# Patient Record
Sex: Female | Born: 1960 | Race: White | Hispanic: No | Marital: Married | Smoking: Never smoker
Health system: Southern US, Community
[De-identification: ages and names within clinical notes are randomized; demographics above are authoritative.]

## PROBLEM LIST (undated history)

## (undated) DIAGNOSIS — D249 Benign neoplasm of unspecified breast: Secondary | ICD-10-CM

## (undated) DIAGNOSIS — D219 Benign neoplasm of connective and other soft tissue, unspecified: Secondary | ICD-10-CM

## (undated) DIAGNOSIS — K219 Gastro-esophageal reflux disease without esophagitis: Secondary | ICD-10-CM

## (undated) DIAGNOSIS — N6009 Solitary cyst of unspecified breast: Secondary | ICD-10-CM

## (undated) DIAGNOSIS — I341 Nonrheumatic mitral (valve) prolapse: Secondary | ICD-10-CM

## (undated) HISTORY — PX: BREAST CYST ASPIRATION: SHX578

## (undated) HISTORY — DX: Solitary cyst of unspecified breast: N60.09

## (undated) HISTORY — DX: Gastro-esophageal reflux disease without esophagitis: K21.9

## (undated) HISTORY — PX: PELVIC LAPAROSCOPY: SHX162

## (undated) HISTORY — PX: TONSILLECTOMY: SUR1361

## (undated) HISTORY — DX: Nonrheumatic mitral (valve) prolapse: I34.1

## (undated) HISTORY — DX: Benign neoplasm of unspecified breast: D24.9

## (undated) HISTORY — DX: Benign neoplasm of connective and other soft tissue, unspecified: D21.9

---

## 1998-11-21 ENCOUNTER — Other Ambulatory Visit: Admission: RE | Admit: 1998-11-21 | Discharge: 1998-11-21 | Payer: Self-pay | Admitting: Obstetrics and Gynecology

## 2000-01-14 ENCOUNTER — Other Ambulatory Visit: Admission: RE | Admit: 2000-01-14 | Discharge: 2000-01-14 | Payer: Self-pay | Admitting: Obstetrics and Gynecology

## 2001-01-31 ENCOUNTER — Other Ambulatory Visit: Admission: RE | Admit: 2001-01-31 | Discharge: 2001-01-31 | Payer: Self-pay | Admitting: Obstetrics and Gynecology

## 2002-03-22 ENCOUNTER — Other Ambulatory Visit: Admission: RE | Admit: 2002-03-22 | Discharge: 2002-03-22 | Payer: Self-pay | Admitting: Obstetrics and Gynecology

## 2003-04-18 ENCOUNTER — Other Ambulatory Visit: Admission: RE | Admit: 2003-04-18 | Discharge: 2003-04-18 | Payer: Self-pay | Admitting: Obstetrics and Gynecology

## 2004-06-02 ENCOUNTER — Other Ambulatory Visit: Admission: RE | Admit: 2004-06-02 | Discharge: 2004-06-02 | Payer: Self-pay | Admitting: Obstetrics and Gynecology

## 2004-06-18 ENCOUNTER — Ambulatory Visit: Payer: Self-pay | Admitting: Internal Medicine

## 2004-07-26 ENCOUNTER — Ambulatory Visit: Payer: Self-pay | Admitting: Family Medicine

## 2004-11-25 ENCOUNTER — Ambulatory Visit: Payer: Self-pay | Admitting: Internal Medicine

## 2005-06-04 ENCOUNTER — Ambulatory Visit: Payer: Self-pay | Admitting: Internal Medicine

## 2005-06-11 ENCOUNTER — Other Ambulatory Visit: Admission: RE | Admit: 2005-06-11 | Discharge: 2005-06-11 | Payer: Self-pay | Admitting: Obstetrics and Gynecology

## 2006-06-10 ENCOUNTER — Encounter: Admission: RE | Admit: 2006-06-10 | Discharge: 2006-06-10 | Payer: Self-pay | Admitting: Internal Medicine

## 2006-06-10 ENCOUNTER — Ambulatory Visit: Payer: Self-pay | Admitting: Internal Medicine

## 2006-09-30 ENCOUNTER — Other Ambulatory Visit: Admission: RE | Admit: 2006-09-30 | Discharge: 2006-09-30 | Payer: Self-pay | Admitting: Obstetrics and Gynecology

## 2006-11-25 ENCOUNTER — Encounter: Payer: Self-pay | Admitting: Internal Medicine

## 2007-04-12 ENCOUNTER — Telehealth (INDEPENDENT_AMBULATORY_CARE_PROVIDER_SITE_OTHER): Payer: Self-pay | Admitting: *Deleted

## 2008-03-19 ENCOUNTER — Other Ambulatory Visit: Admission: RE | Admit: 2008-03-19 | Discharge: 2008-03-19 | Payer: Self-pay | Admitting: Obstetrics and Gynecology

## 2008-04-28 ENCOUNTER — Ambulatory Visit: Payer: Self-pay | Admitting: Internal Medicine

## 2008-10-10 ENCOUNTER — Ambulatory Visit: Payer: Self-pay | Admitting: Obstetrics and Gynecology

## 2008-10-23 ENCOUNTER — Ambulatory Visit: Payer: Self-pay | Admitting: Family Medicine

## 2008-11-21 ENCOUNTER — Ambulatory Visit: Payer: Self-pay | Admitting: Obstetrics and Gynecology

## 2008-12-21 ENCOUNTER — Ambulatory Visit: Payer: Self-pay | Admitting: Internal Medicine

## 2008-12-21 DIAGNOSIS — L259 Unspecified contact dermatitis, unspecified cause: Secondary | ICD-10-CM | POA: Insufficient documentation

## 2008-12-21 DIAGNOSIS — M2550 Pain in unspecified joint: Secondary | ICD-10-CM | POA: Insufficient documentation

## 2008-12-25 LAB — CONVERTED CEMR LAB
Basophils Absolute: 0 10*3/uL (ref 0.0–0.1)
Eosinophils Relative: 1.5 % (ref 0.0–5.0)
HCT: 43.9 % (ref 36.0–46.0)
Hemoglobin: 15.4 g/dL — ABNORMAL HIGH (ref 12.0–15.0)
Lymphocytes Relative: 33.3 % (ref 12.0–46.0)
MCV: 93.1 fL (ref 78.0–100.0)
Monocytes Relative: 9.6 % (ref 3.0–12.0)
Neutrophils Relative %: 55.5 % (ref 43.0–77.0)
Platelets: 151 10*3/uL (ref 150.0–400.0)
RBC: 4.71 M/uL (ref 3.87–5.11)
TSH: 1.27 microintl units/mL (ref 0.35–5.50)
WBC: 6.7 10*3/uL (ref 4.5–10.5)

## 2009-06-04 ENCOUNTER — Other Ambulatory Visit: Admission: RE | Admit: 2009-06-04 | Discharge: 2009-06-04 | Payer: Self-pay | Admitting: Radiology

## 2009-06-07 ENCOUNTER — Encounter (INDEPENDENT_AMBULATORY_CARE_PROVIDER_SITE_OTHER): Payer: Self-pay | Admitting: *Deleted

## 2010-03-25 ENCOUNTER — Ambulatory Visit: Payer: Self-pay | Admitting: Obstetrics and Gynecology

## 2010-03-25 ENCOUNTER — Other Ambulatory Visit: Admission: RE | Admit: 2010-03-25 | Discharge: 2010-03-25 | Payer: Self-pay | Admitting: Obstetrics and Gynecology

## 2010-04-22 ENCOUNTER — Ambulatory Visit: Payer: Self-pay | Admitting: Obstetrics and Gynecology

## 2010-05-08 ENCOUNTER — Ambulatory Visit: Payer: Self-pay | Admitting: Internal Medicine

## 2010-08-26 NOTE — Assessment & Plan Note (Signed)
Summary: FLU SHOT/CB  Nurse Visit   Allergies: No Known Drug Allergies  Orders Added: 1)  Admin 1st Vaccine [90471] 2)  Flu Vaccine 63yrs + [60454]  Flu Vaccine Consent Questions     Do you have a history of severe allergic reactions to this vaccine? no    Any prior history of allergic reactions to egg and/or gelatin? no    Do you have a sensitivity to the preservative Thimersol? no    Do you have a past history of Guillan-Barre Syndrome? no    Do you currently have an acute febrile illness? no    Have you ever had a severe reaction to latex? no    Vaccine information given and explained to patient? yes    Are you currently pregnant? no    Lot Number:[id number]   Exp Date:01/24/2011   Site Given  Left Deltoid IM Romualdo Bolk, CMA Duncan Dull)  May 08, 2010 3:36 PM

## 2010-08-26 NOTE — Assessment & Plan Note (Signed)
Summary: FLU SHOT // RS  Nurse Visit   Allergies: No Known Drug Allergies  Orders Added: 1)  Admin 1st Vaccine [90471] 2)  Flu Vaccine 55yrs + [81191]     Flu Vaccine Consent Questions     Do you have a history of severe allergic reactions to this vaccine? no    Any prior history of allergic reactions to egg and/or gelatin? no    Do you have a sensitivity to the preservative Thimersol? no    Do you have a past history of Guillan-Barre Syndrome? no    Do you currently have an acute febrile illness? no    Have you ever had a severe reaction to latex? no    Vaccine information given and explained to patient? yes    Are you currently pregnant? no    Lot Number:[id number]   Exp Date:01/24/2011   Site Given  Left Deltoid IM Romualdo Bolk, CMA (AAMA)  May 30, 2010 8:08 AM

## 2011-01-12 ENCOUNTER — Ambulatory Visit (INDEPENDENT_AMBULATORY_CARE_PROVIDER_SITE_OTHER): Payer: BC Managed Care – PPO | Admitting: Obstetrics and Gynecology

## 2011-01-12 DIAGNOSIS — N63 Unspecified lump in unspecified breast: Secondary | ICD-10-CM

## 2011-01-12 DIAGNOSIS — L659 Nonscarring hair loss, unspecified: Secondary | ICD-10-CM

## 2011-01-12 DIAGNOSIS — R5383 Other fatigue: Secondary | ICD-10-CM

## 2011-01-12 DIAGNOSIS — N6009 Solitary cyst of unspecified breast: Secondary | ICD-10-CM

## 2011-04-01 ENCOUNTER — Other Ambulatory Visit (HOSPITAL_COMMUNITY)
Admission: RE | Admit: 2011-04-01 | Discharge: 2011-04-01 | Disposition: A | Discharge status: Home / Self Care | Payer: BC Managed Care – PPO | Source: Ambulatory Visit | Attending: Obstetrics and Gynecology | Admitting: Obstetrics and Gynecology

## 2011-04-01 ENCOUNTER — Encounter: Payer: Self-pay | Admitting: Obstetrics and Gynecology

## 2011-04-01 ENCOUNTER — Ambulatory Visit (INDEPENDENT_AMBULATORY_CARE_PROVIDER_SITE_OTHER): Payer: BC Managed Care – PPO | Admitting: Obstetrics and Gynecology

## 2011-04-01 ENCOUNTER — Encounter: Payer: Self-pay | Admitting: *Deleted

## 2011-04-01 DIAGNOSIS — D249 Benign neoplasm of unspecified breast: Secondary | ICD-10-CM | POA: Insufficient documentation

## 2011-04-01 DIAGNOSIS — R1031 Right lower quadrant pain: Secondary | ICD-10-CM

## 2011-04-01 DIAGNOSIS — N912 Amenorrhea, unspecified: Secondary | ICD-10-CM

## 2011-04-01 DIAGNOSIS — D219 Benign neoplasm of connective and other soft tissue, unspecified: Secondary | ICD-10-CM | POA: Insufficient documentation

## 2011-04-01 DIAGNOSIS — N809 Endometriosis, unspecified: Secondary | ICD-10-CM | POA: Insufficient documentation

## 2011-04-01 DIAGNOSIS — Z01419 Encounter for gynecological examination (general) (routine) without abnormal findings: Secondary | ICD-10-CM

## 2011-04-01 DIAGNOSIS — I341 Nonrheumatic mitral (valve) prolapse: Secondary | ICD-10-CM | POA: Insufficient documentation

## 2011-04-01 DIAGNOSIS — N6009 Solitary cyst of unspecified breast: Secondary | ICD-10-CM | POA: Insufficient documentation

## 2011-04-01 DIAGNOSIS — N898 Other specified noninflammatory disorders of vagina: Secondary | ICD-10-CM

## 2011-04-01 DIAGNOSIS — R300 Dysuria: Secondary | ICD-10-CM

## 2011-04-01 DIAGNOSIS — B373 Candidiasis of vulva and vagina: Secondary | ICD-10-CM

## 2011-04-01 DIAGNOSIS — K219 Gastro-esophageal reflux disease without esophagitis: Secondary | ICD-10-CM | POA: Insufficient documentation

## 2011-04-01 MED ORDER — CIPROFLOXACIN HCL 500 MG PO TABS: 500.0000 mg | ORAL_TABLET | Freq: Two times a day (BID) | ORAL | Status: DC

## 2011-04-01 MED ORDER — TERCONAZOLE 0.8 % VA CREA: 1.0000 | TOPICAL_CREAM | Freq: Every day | VAGINAL | Status: AC

## 2011-04-01 NOTE — Patient Instructions (Signed)
Take medication for her UTI and yeast infection. Schedule pelvic ultrasound. Discuss aditional breast imaging with Dr. Jamey Ripa in the fall after your mammogram. Call Friday for urine culture results.

## 2011-04-01 NOTE — Progress Notes (Signed)
Patient came to see me today both for annual GYN exam and a problem visit. She has had some urinary symptoms that consist of back pain and lower abdominal pain with low-grade fever. She was seen at a pharmacy clinic and treated with Macrobid. She stopped yesterday after no improvement over 4-5 days. She's  Had a vaginal discharge.  Her last menstrual period was 7 weeks ago. She will miss periods. She contracepts by vasectomy. She is having hot flashes. The radiologist last year discussed with her additional breast imaging because of her family history. She's been under a lot of stress and does not remember the conversation.  Physical examination: HEENT within normal limits. Neck: Thyroid not large. No masses. Supraclavicular nodes: not enlarged. Breasts: Examined in both sitting midline position. No skin changes and no masses. Abdomen: Soft no guarding rebound or masses or hernia. Pelvic: External: Within normal limits. BUS: Within normal limits. Vaginal:within normal limits. Good estrogen effect. No evidence of cystocele rectocele or enterocele. Cervix: clean. Uterus: enlarged by fibroids to 11-12 weeks size. Adnexa:cannot be examined due to large size of fibroids. Rectovaginal exam: Confirmatory and negative. Extremities: Within normal limits. Wet prep positive for yeast. Urinalysis negative.  Assessment: 1. Amenorrhea 2. Hot flashes 3. Yeast vaginitis 4. Possible UTI 5. Strong family history of breast cancer  Plan: 1. Patient feels bit today so we started her on Cipro 500 mg twice a day for 7 days 2.She will call for her urine culture results on Friday 3. She was scheduled pelvic ultrasound 4. She will make an appointment to discuss additional breast imaging Dr. Seward Carol after her mammogram. 5. She was treated for her yeast infection today. 6. FSH checked.

## 2011-04-03 ENCOUNTER — Telehealth: Payer: Self-pay

## 2011-04-03 LAB — URINE CULTURE: Organism ID, Bacteria: NO GROWTH

## 2011-04-03 NOTE — Telephone Encounter (Signed)
Tell patient to stop Cipro. She can take Benadryl for the rash. She does not urinary tract infection. Her white blood cell count did not show infection. I would like her to keep her appointment for her ultrasound. I also think she should see Dr. Cato Mulligan.

## 2011-04-03 NOTE — Telephone Encounter (Signed)
PER PT. REQUEST. NOTIFIED PT BY HER CELL# VOICEMAIL OF DR. G'S NOTE BELOW.

## 2011-04-03 NOTE — Telephone Encounter (Signed)
FROM WHAT I CAN TELL PTS. URINE CULTURE IS NEGATIVE. ALSO SHE STATES SHE HAS TAKEN 3 CIPRO PILLS-NONE TODAY AND IS HAVING A SEVERE RASH ON HER TRUNK, ARMS & LEGS. ALSO C-O PERSISTENT FEVER. OF 100.92F THIS A.M. TOOK IBUPROFEN & ZYRTEC AND HAS NOT RECHECKED FEVER THIS P.M. SINCE SHE IS AT WORK. IF SHE DOES NOT HAVE A UTI IS WORRIED WHAT IS CAUSING FEVER. HER PCP IS DR. SWORDS.

## 2011-04-06 ENCOUNTER — Encounter: Payer: Self-pay | Admitting: Family Medicine

## 2011-04-06 ENCOUNTER — Ambulatory Visit (INDEPENDENT_AMBULATORY_CARE_PROVIDER_SITE_OTHER): Payer: BC Managed Care – PPO | Admitting: Family Medicine

## 2011-04-06 VITALS — BP 120/70 | Temp 99.5°F | Wt 126.0 lb

## 2011-04-06 DIAGNOSIS — R509 Fever, unspecified: Secondary | ICD-10-CM

## 2011-04-06 DIAGNOSIS — R1011 Right upper quadrant pain: Secondary | ICD-10-CM

## 2011-04-06 DIAGNOSIS — R6889 Other general symptoms and signs: Secondary | ICD-10-CM

## 2011-04-06 DIAGNOSIS — R634 Abnormal weight loss: Secondary | ICD-10-CM

## 2011-04-06 DIAGNOSIS — R7989 Other specified abnormal findings of blood chemistry: Secondary | ICD-10-CM

## 2011-04-06 NOTE — Progress Notes (Addendum)
Subjective:    Patient ID: Monica English, female    DOB: 1961-06-16, 50 y.o.   MRN: [id number]  HPI Seen with chief complaint of fever for 10 days. She relates temperature between 99.3 and up to 101. Fever comes and goes over 10 days. She's had some intermittent headaches which are poorly localized and intermittent chills. Occasional nausea without vomiting. She is not aware of any specific arthralgias. End of August had some abdominal pain radiating to back. Was placed at another facility on Macrobid and subsequently saw her gynecologist. Urine culture was sent which was subsequent negative and patient switched to Cipro. She developed urticaria on Cipro and has been off all antibiotics for several days now. No dysuria at this time.  Patient denies any history of tick bite. Denies nasal congestion or cough. No arthralgias. No ill exposures. No recent travels. Denies dyspnea. Mild right upper quadrant pain which does not seem to correlate with eating. Appetite comes and goes. Denies diarrhea. No sore throat.  Past Medical History  Diagnosis Date  . Endometriosis   . Fibroid   . Fibroadenoma of breast     left  breast  . Cyst of breast     left  . Acid reflux   . Mitral valve prolapse    Past Surgical History  Procedure Date  . Tonsillectomy   . Dx lap with laser of endometrosis   . Pelvic laparoscopy   . Breast cyst aspiration     reports that she has never smoked. She has never used smokeless tobacco. She reports that she does not drink alcohol or use illicit drugs. family history includes Breast cancer in her maternal grandmother, mother, and other; Colon cancer in her other; Heart disease in her paternal grandfather and paternal grandmother; and Hypertension in her mother. Allergies  Allergen Reactions  . Ciprofloxacin     hives      Review of Systems  Constitutional: Positive for fever, chills, appetite change and fatigue.  HENT: Negative for ear pain, sore throat and  voice change.   Respiratory: Negative for cough, shortness of breath and wheezing.   Cardiovascular: Negative for chest pain, palpitations and leg swelling.  Gastrointestinal: Positive for nausea and abdominal pain. Negative for vomiting, constipation and blood in stool.  Genitourinary: Negative for dysuria and hematuria.  Skin: Negative for rash.  Neurological: Positive for headaches. Negative for dizziness.  Hematological: Negative for adenopathy.  Psychiatric/Behavioral: Positive for confusion.       Objective:   Physical Exam  Constitutional: She is oriented to person, place, and time. She appears well-developed and well-nourished. No distress.  HENT:  Right Ear: External ear normal.  Left Ear: External ear normal.  Mouth/Throat: Oropharynx is clear and moist.  Eyes: Pupils are equal, round, and reactive to light. Right eye exhibits no discharge. Left eye exhibits no discharge.  Neck: Neck supple. No thyromegaly present.  Cardiovascular: Normal rate, regular rhythm and normal heart sounds.   No murmur heard. Pulmonary/Chest: Effort normal and breath sounds normal. No respiratory distress. She has no wheezes. She has no rales.  Abdominal: Soft. She exhibits no distension and no mass. There is no rebound and no guarding.       Mildly tender right upper quadrant  Musculoskeletal: She exhibits no edema.  Lymphadenopathy:    She has no cervical adenopathy.  Neurological: She is alert and oriented to person, place, and time.  Psychiatric: She has a normal mood and affect. Her behavior is normal.  Assessment & Plan:  Fever of 10 days duration. Differential at this point is broad. Patient does not appear toxic. Doubt connective tissue disorder. Patient has lost some weight over the past year but some of this due to her efforts. We mentioned possibility of infectious agent such as cytomegalovirus. She does have some mild right upper quadrant tenderness-but no acute abdomen.  Start with basic labs including CBC, chemistries, C. reactive protein, TSH, chest x-ray. Consider upper abdominal ultrasound.  Mildly elevated liver transaminases, normal alkaline phosphatase, normal white blood count with elevated lymphocytes, and low TSH. We'll go ahead and order T4, T3, and repeat TSH. Abdominal ultrasound. Patient has been notified of lab results. Symptoms unchanged

## 2011-04-06 NOTE — Patient Instructions (Signed)
Continue to monitor temperatures and follow up promptly for any new symptoms.

## 2011-04-07 LAB — CBC WITH DIFFERENTIAL/PLATELET
Basophils Absolute: 0.1 10*3/uL (ref 0.0–0.1)
Eosinophils Absolute: 0.2 10*3/uL (ref 0.0–0.7)
HCT: 38.8 % (ref 36.0–46.0)
Lymphocytes Relative: 61.8 % — ABNORMAL HIGH (ref 12.0–46.0)
Lymphs Abs: 5.4 10*3/uL — ABNORMAL HIGH (ref 0.7–4.0)
MCV: 91.6 fl (ref 78.0–100.0)
Monocytes Absolute: 0.6 10*3/uL (ref 0.1–1.0)
Monocytes Relative: 7 % (ref 3.0–12.0)
Neutro Abs: 2.4 10*3/uL (ref 1.4–7.7)
WBC: 8.7 10*3/uL (ref 4.5–10.5)

## 2011-04-07 LAB — BASIC METABOLIC PANEL
BUN: 15 mg/dL (ref 6–23)
CO2: 27 mEq/L (ref 19–32)
Calcium: 8.2 mg/dL — ABNORMAL LOW (ref 8.4–10.5)
Chloride: 102 mEq/L (ref 96–112)
Glucose, Bld: 72 mg/dL (ref 70–99)
Potassium: 4.2 mEq/L (ref 3.5–5.1)

## 2011-04-07 LAB — TSH: TSH: 0.26 u[IU]/mL — ABNORMAL LOW (ref 0.35–5.50)

## 2011-04-07 LAB — HEPATIC FUNCTION PANEL
Albumin: 3.6 g/dL (ref 3.5–5.2)
Bilirubin, Direct: 0.1 mg/dL (ref 0.0–0.3)

## 2011-04-08 NOTE — Progress Notes (Signed)
Quick Note:  [facility]) will call and schedule labs for tomorrow, Camelia Eng did get [place] order ______

## 2011-04-08 NOTE — Progress Notes (Signed)
Addended by: Kristian Covey on: 04/08/2011 08:30 AM   Modules accepted: Orders

## 2011-04-09 ENCOUNTER — Other Ambulatory Visit (INDEPENDENT_AMBULATORY_CARE_PROVIDER_SITE_OTHER): Payer: BC Managed Care – PPO

## 2011-04-09 DIAGNOSIS — R6889 Other general symptoms and signs: Secondary | ICD-10-CM

## 2011-04-09 DIAGNOSIS — R7989 Other specified abnormal findings of blood chemistry: Secondary | ICD-10-CM

## 2011-04-13 ENCOUNTER — Ambulatory Visit
Admission: RE | Admit: 2011-04-13 | Discharge: 2011-04-13 | Disposition: A | Discharge status: Home / Self Care | Payer: BC Managed Care – PPO | Source: Ambulatory Visit | Attending: Family Medicine | Admitting: Family Medicine

## 2011-04-13 DIAGNOSIS — R509 Fever, unspecified: Secondary | ICD-10-CM

## 2011-04-13 DIAGNOSIS — R1011 Right upper quadrant pain: Secondary | ICD-10-CM

## 2011-04-15 ENCOUNTER — Other Ambulatory Visit: Payer: Self-pay | Admitting: Family Medicine

## 2011-04-15 ENCOUNTER — Other Ambulatory Visit (INDEPENDENT_AMBULATORY_CARE_PROVIDER_SITE_OTHER): Payer: BC Managed Care – PPO

## 2011-04-15 ENCOUNTER — Telehealth: Payer: Self-pay | Admitting: Internal Medicine

## 2011-04-15 DIAGNOSIS — B001 Herpesviral vesicular dermatitis: Secondary | ICD-10-CM

## 2011-04-15 DIAGNOSIS — R161 Splenomegaly, not elsewhere classified: Secondary | ICD-10-CM

## 2011-04-15 NOTE — Progress Notes (Signed)
Quick Note:  Monospot lab ordered, scheduled for lab visit today, pt aware of CT and will schedule F/U ______

## 2011-04-15 NOTE — Telephone Encounter (Signed)
Pt  Requesting results of ultra sound. Please contact

## 2011-04-16 NOTE — Telephone Encounter (Signed)
Please advise 

## 2011-04-19 NOTE — Telephone Encounter (Signed)
Ultrasound ok Spleen slightly enlarged.  If fever has resolved no further eval necessary except for cbc  In 2 months

## 2011-04-20 ENCOUNTER — Ambulatory Visit (INDEPENDENT_AMBULATORY_CARE_PROVIDER_SITE_OTHER): Payer: BC Managed Care – PPO | Admitting: Obstetrics and Gynecology

## 2011-04-20 ENCOUNTER — Encounter: Payer: Self-pay | Admitting: Family Medicine

## 2011-04-20 ENCOUNTER — Ambulatory Visit (INDEPENDENT_AMBULATORY_CARE_PROVIDER_SITE_OTHER): Payer: BC Managed Care – PPO | Admitting: Family Medicine

## 2011-04-20 ENCOUNTER — Ambulatory Visit: Admission: RE | Admit: 2011-04-20 | Payer: BC Managed Care – PPO | Source: Ambulatory Visit

## 2011-04-20 VITALS — BP 110/70 | Temp 97.8°F | Wt 124.0 lb

## 2011-04-20 DIAGNOSIS — R1031 Right lower quadrant pain: Secondary | ICD-10-CM

## 2011-04-20 DIAGNOSIS — D219 Benign neoplasm of connective and other soft tissue, unspecified: Secondary | ICD-10-CM

## 2011-04-20 DIAGNOSIS — Z780 Asymptomatic menopausal state: Secondary | ICD-10-CM

## 2011-04-20 DIAGNOSIS — D259 Leiomyoma of uterus, unspecified: Secondary | ICD-10-CM

## 2011-04-20 DIAGNOSIS — R509 Fever, unspecified: Secondary | ICD-10-CM

## 2011-04-20 LAB — CBC WITH DIFFERENTIAL/PLATELET
Basophils Relative: 0.6 % (ref 0.0–3.0)
Eosinophils Relative: 1.2 % (ref 0.0–5.0)
HCT: 39.8 % (ref 36.0–46.0)
Hemoglobin: 13.3 g/dL (ref 12.0–15.0)
Lymphocytes Relative: 70.9 % — ABNORMAL HIGH (ref 12.0–46.0)
Lymphs Abs: 5.8 10*3/uL — ABNORMAL HIGH (ref 0.7–4.0)
MCHC: 33.5 g/dL (ref 30.0–36.0)
MCV: 92.2 fl (ref 78.0–100.0)
Platelets: 227 10*3/uL (ref 150.0–400.0)
RBC: 4.32 Mil/uL (ref 3.87–5.11)
RDW: 15.2 % — ABNORMAL HIGH (ref 11.5–14.6)
WBC: 8.2 10*3/uL (ref 4.5–10.5)

## 2011-04-20 NOTE — Telephone Encounter (Signed)
Pt aware.

## 2011-04-20 NOTE — Progress Notes (Signed)
The patient came back to see me today. On ultrasound she has multiple fibroids. Dimensions are on the ultrasound sheet. Her endometrial echo is thin at 4.3 mm. Her ovaries can be imaged and are normal. Her cul-de-sac is free of fluid. She continues to work with her PCP due to fever elevated lymphocytes and slightly enlarged spleen. She was having night sweats. However since her temperature returned to normal they have disappeared. Her urine culture showed no growth.  Assessment: 1. Fibroids 2. Early menopause  Plan: Discussed in detail the findings of the ultrasound. Discussed the fact that she probably won't have another period. She will let me know if she does. She will call when necessary need for HRT. She'll return in one year.

## 2011-04-20 NOTE — Patient Instructions (Signed)
Follow up immediately for any recurrent fever or for any continued weight loss.

## 2011-04-20 NOTE — Progress Notes (Signed)
  Subjective:    Patient ID: Monica English, female    DOB: 1961/07/08, 50 y.o.   MRN: [id number]  HPI Patient seen for followup regarding prolonged fever. She had documented temperature over 100 for almost 3 weeks. She had presented here also with some moderate weight loss. She felt some of the weight loss may been due to some stress issues. She had several labs significant for normal white blood count with elevated lymphocytes, low TSH but normal T4-T3, and elevated AST and ALT.  She also had some intermittent right upper quadrant abdominal pain. Ultrasound revealed some splenomegaly otherwise normal exam-no gallstones. We considered viral etiologies. Monospot negative. Discussed other possible causes of lymphocytosis and prolonged fever and splenomegaly including CMV. At this point her fever has been resolved for the past 3 days. She has no chronic sinusitis symptoms, cough, further abdominal pain, diarrhea, arthralgias, or any skin rash.   Review of Systems  Constitutional: Positive for fatigue and unexpected weight change. Negative for chills.  Respiratory: Negative for shortness of breath.   Cardiovascular: Negative for chest pain.  Gastrointestinal: Negative for nausea, vomiting, abdominal pain, diarrhea and blood in stool.  Genitourinary: Negative for dysuria.  Musculoskeletal: Negative for back pain.  Skin: Negative for rash.  Neurological: Negative for dizziness and headaches.  Hematological: Negative for adenopathy.       Objective:   Physical Exam  Constitutional: She is oriented to person, place, and time. She appears well-developed and well-nourished.  HENT:  Right Ear: External ear normal.  Left Ear: External ear normal.  Mouth/Throat: Oropharynx is clear and moist.  Neck: Neck supple. No thyromegaly present.  Cardiovascular: Normal rate and regular rhythm.   No murmur heard. Pulmonary/Chest: Effort normal and breath sounds normal. No respiratory distress. She has no  wheezes. She has no rales.  Abdominal: Soft. She exhibits no distension and no mass. There is no tenderness. There is no rebound and no guarding.       No hepatomegaly or splenomegaly noted  Musculoskeletal: She exhibits no edema.  Lymphadenopathy:    She has no cervical adenopathy.  Neurological: She is alert and oriented to person, place, and time. No cranial nerve deficit.  Skin: No rash noted.          Assessment & Plan:  History of prolonged fever. Patient presented with other abnormality including mildly elevated liver transaminases, lymphocytosis with normal white blood cell count, and splenomegaly on ultrasound. Question CMV.  Repeat CBC, hepatic panel, and CMV IgM. If she has any recurrent fever (or ongoing weight loss) consider CT abdomen and pelvis. Recent chest x-ray unremarkable.  She does not have any significant adenopathy.

## 2011-04-22 NOTE — Progress Notes (Signed)
Quick Note:  Pt informed ______ 

## 2011-07-02 ENCOUNTER — Ambulatory Visit (INDEPENDENT_AMBULATORY_CARE_PROVIDER_SITE_OTHER): Payer: BC Managed Care – PPO | Admitting: Internal Medicine

## 2011-07-02 DIAGNOSIS — Z23 Encounter for immunization: Secondary | ICD-10-CM

## 2011-07-07 ENCOUNTER — Ambulatory Visit: Payer: BC Managed Care – PPO | Admitting: Internal Medicine

## 2012-04-06 ENCOUNTER — Ambulatory Visit (INDEPENDENT_AMBULATORY_CARE_PROVIDER_SITE_OTHER): Payer: BC Managed Care – PPO | Admitting: Internal Medicine

## 2012-04-06 ENCOUNTER — Encounter: Payer: Self-pay | Admitting: Internal Medicine

## 2012-04-06 VITALS — BP 122/80 | HR 77 | Temp 98.2°F | Wt 121.0 lb

## 2012-04-06 DIAGNOSIS — I441 Atrioventricular block, second degree: Secondary | ICD-10-CM

## 2012-04-06 DIAGNOSIS — R002 Palpitations: Secondary | ICD-10-CM

## 2012-04-06 LAB — CBC WITH DIFFERENTIAL/PLATELET
Eosinophils Absolute: 0.1 10*3/uL (ref 0.0–0.7)
HCT: 45.9 % (ref 36.0–46.0)
Hemoglobin: 15.3 g/dL — ABNORMAL HIGH (ref 12.0–15.0)
Lymphs Abs: 3.4 10*3/uL (ref 0.7–4.0)
MCV: 92.3 fl (ref 78.0–100.0)
Monocytes Absolute: 0.6 10*3/uL (ref 0.1–1.0)
Neutro Abs: 2.5 10*3/uL (ref 1.4–7.7)
RBC: 4.98 Mil/uL (ref 3.87–5.11)

## 2012-04-06 LAB — TSH: TSH: 0.98 u[IU]/mL (ref 0.35–5.50)

## 2012-04-06 NOTE — Assessment & Plan Note (Signed)
New dx See EKG Check labs and refer to CV

## 2012-04-07 LAB — BASIC METABOLIC PANEL
BUN: 21 mg/dL (ref 6–23)
CO2: 28 mEq/L (ref 19–32)
Chloride: 103 mEq/L (ref 96–112)
Creatinine, Ser: 0.8 mg/dL (ref 0.4–1.2)
GFR: 80.55 mL/min (ref >60.00)

## 2012-04-09 NOTE — Progress Notes (Signed)
Patient ID: Monica English, female   DOB: 10/29/1960, 51 y.o.   MRN: [id number] Pt with hx of MVP and palpitations She is now having 4 days of frequent palptiations: "skipped heart beat and pounding" No chest pain, no SOB  Past Medical History  Diagnosis Date  . Endometriosis   . Fibroid   . Fibroadenoma of breast     left  breast  . Cyst of breast     left  . Acid reflux   . Mitral valve prolapse     History   Social History  . Marital Status: Married    Spouse Name: N/A    Number of Children: N/A  . Years of Education: N/A   Occupational History  . Not on file.   Social History Main Topics  . Smoking status: Never Smoker   . Smokeless tobacco: Never Used  . Alcohol Use: No  . Drug Use: No  . Sexually Active: Yes     vasectomy   Other Topics Concern  . Not on file   Social History Narrative  . No narrative on file    Past Surgical History  Procedure Date  . Tonsillectomy   . Dx lap with laser of endometrosis   . Pelvic laparoscopy   . Breast cyst aspiration     Family History  Problem Relation Age of Onset  . Hypertension Mother   . Breast cancer Mother   . Breast cancer Maternal Grandmother   . Heart disease Paternal Grandmother   . Heart disease Paternal Grandfather   . Colon cancer Other   . Breast cancer Other     Allergies  Allergen Reactions  . Ciprofloxacin     hives    Current Outpatient Prescriptions on File Prior to Visit  Medication Sig Dispense Refill  . ibuprofen (ADVIL,MOTRIN) 200 MG tablet Take 200 mg by mouth every 6 (six) hours as needed.        . ranitidine (ZANTAC) 150 MG tablet Take 75 mg by mouth daily.          patient denies chest pain, shortness of breath, orthopnea. Denies lower extremity edema, abdominal pain, change in appetite, change in bowel movements. Patient denies rashes, musculoskeletal complaints. No other specific complaints in a complete review of systems.   BP 122/80  Pulse 77  Temp 98.2 F (36.8 C)  (Oral)  Wt 121 lb (54.885 kg)  Well-developed well-nourished female in no acute distress. HEENT exam atraumatic, normocephalic, extraocular muscles are intact. Neck is supple. No jugular venous distention no thyromegaly. Chest clear to auscultation without increased work of breathing. Cardiac exam S1 and S2 are regular. Abdominal exam active bowel sounds, soft, nontender. Extremities no edema. Neurologic exam she is alert without any motor sensory deficits. Gait is normal.

## 2012-04-20 ENCOUNTER — Encounter: Payer: Self-pay | Admitting: Obstetrics and Gynecology

## 2012-04-20 ENCOUNTER — Encounter (INDEPENDENT_AMBULATORY_CARE_PROVIDER_SITE_OTHER): Payer: Self-pay

## 2012-04-22 ENCOUNTER — Encounter (INDEPENDENT_AMBULATORY_CARE_PROVIDER_SITE_OTHER): Payer: Self-pay

## 2012-04-22 ENCOUNTER — Encounter: Payer: Self-pay | Admitting: Obstetrics and Gynecology

## 2012-04-25 ENCOUNTER — Encounter: Payer: Self-pay | Admitting: Cardiovascular Disease

## 2012-04-25 ENCOUNTER — Ambulatory Visit (INDEPENDENT_AMBULATORY_CARE_PROVIDER_SITE_OTHER): Payer: BC Managed Care – PPO | Admitting: Cardiovascular Disease

## 2012-04-25 VITALS — BP 115/71 | HR 68 | Ht 65.0 in | Wt 124.0 lb

## 2012-04-25 DIAGNOSIS — R9431 Abnormal electrocardiogram [ECG] [EKG]: Secondary | ICD-10-CM

## 2012-04-25 DIAGNOSIS — I059 Rheumatic mitral valve disease, unspecified: Secondary | ICD-10-CM

## 2012-04-25 DIAGNOSIS — I341 Nonrheumatic mitral (valve) prolapse: Secondary | ICD-10-CM

## 2012-04-25 DIAGNOSIS — R079 Chest pain, unspecified: Secondary | ICD-10-CM

## 2012-04-25 DIAGNOSIS — R002 Palpitations: Secondary | ICD-10-CM

## 2012-04-25 NOTE — Progress Notes (Signed)
Patient ID: Monica English, female   DOB: [birth year], 51 y.o.   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  She has had a lot of stress last couple of years with divorce and home schooling kids.  Has been remarried. I revviewed her ECG from 9/11 and it was misread by computer and looked normal to me.  She has been Dx with MVP for years.  We have no echo on her  She has had palpitations last few weeks.  Can be worse with exertion and can ppt chest pressure.  Still getting them weekly and last minutes. No presynope or dyspnea.    ROS: Denies fever, malais, weight loss, blurry vision, decreased visual acuity, cough, sputum, SOB, hemoptysis, pleuritic pain, palpitaitons, heartburn, abdominal pain, melena, lower extremity edema, claudication, or rash.  All other systems reviewed and negative   General: Affect appropriate Healthy:  appears stated age HEENT: normal Neck supple with no adenopathy JVP normal no bruits no thyromegaly Lungs clear with no wheezing and good diaphragmatic motion Heart:  S1/S2 no murmur,rub, gallop or click PMI normal Abdomen: benighn, BS positve, no tenderness, no AAA no bruit.  No HSM or HJR Distal pulses intact with no bruits No edema Neuro non-focal Skin warm and dry No muscular weakness  Medications Current Outpatient Prescriptions  Medication Sig Dispense Refill  . ibuprofen (ADVIL,MOTRIN) 200 MG tablet Take 200 mg by mouth every 6 (six) hours as needed.        . ranitidine (ZANTAC) 150 MG tablet Take 75 mg by mouth daily.         Allergies Ciprofloxacin  Family History: Family History  Problem Relation Age of Onset  . Hypertension Mother   . Breast cancer Mother   . Breast cancer Maternal Grandmother   . Heart disease Paternal Grandmother   . Heart disease Paternal Grandfather   . Colon cancer Other   . Breast cancer Other     Social History: History   Social History  . Marital Status: Married   Spouse Name: N/A    Number of Children: N/A  . Years of Education: N/A   Occupational History  . Not on file.   Social History Main Topics  . Smoking status: Never Smoker   . Smokeless tobacco: Never Used  . Alcohol Use: No  . Drug Use: No  . Sexually Active: Yes     vasectomy   Other Topics Concern  . Not on file   Social History Narrative  . No narrative on file    Electrocardiogram:  9/11  NSR normal read by computer as AV block  Today NSR normal ECG  Assessment and Plan

## 2012-04-25 NOTE — Assessment & Plan Note (Signed)
Benign sounding but persistant and can ppt chest pain  Event monitor

## 2012-04-25 NOTE — Assessment & Plan Note (Signed)
ECG today with appr lead placement totally normal today  rate 62 normal ECG

## 2012-04-25 NOTE — Patient Instructions (Addendum)
Your physician recommends that you schedule a follow-up appointment in: 8-10 WEEKS  WITH  DR [facility] Your physician recommends that you continue on your current medications as directed. Please refer to the Current Medication list given to you today. Your physician has recommended that you wear an event monitor. Event monitors are medical devices that record the heart's electrical activity. Doctors most often [place] these monitors to diagnose arrhythmias. Arrhythmias are problems with the speed or rhythm of the heartbeat. The monitor is a small, portable device. You can wear one while you do your normal daily activities. This is usually used to diagnose what is causing palpitations/syncope (passing out).  DX 785.1 Your physician has requested that you have a stress echocardiogram. For further information please visit [email or web address] Please follow instruction sheet as given. DX CHEST PAIN

## 2012-04-25 NOTE — Assessment & Plan Note (Signed)
No murmur.  Needs stress echo for chest pain and palpitatoins will view valve during this  No need for SBE

## 2012-05-03 ENCOUNTER — Telehealth: Payer: Self-pay | Admitting: Internal Medicine

## 2012-05-03 NOTE — Telephone Encounter (Signed)
Patient is requesting that Dr. Caryl Never now become her PCP.  Please advise.  I placed the typed note from patient in Dr. Cato Mulligan' folder.

## 2012-05-03 NOTE — Telephone Encounter (Signed)
Ok with me 

## 2012-05-04 ENCOUNTER — Telehealth: Payer: Self-pay | Admitting: *Deleted

## 2012-05-04 NOTE — Telephone Encounter (Signed)
duplicate

## 2012-05-04 NOTE — Telephone Encounter (Signed)
Pt would like to switch from Dr Cato Mulligan to Dr Caryl Never.  She had seen Dr Caryl Never last year and was impressed with the manner in which handled her illness.

## 2012-05-04 NOTE — Telephone Encounter (Signed)
ok 

## 2012-05-11 ENCOUNTER — Other Ambulatory Visit (HOSPITAL_COMMUNITY): Payer: BC Managed Care – PPO

## 2012-05-11 ENCOUNTER — Telehealth (HOSPITAL_COMMUNITY): Payer: Self-pay | Admitting: *Deleted

## 2012-05-11 NOTE — Telephone Encounter (Signed)
Patient was a no show for her scheduled stress echo. I called patient and she stated that she did not want to reschedule.

## 2012-05-18 NOTE — Addendum Note (Signed)
Addended by: Marrion Coy L on: 05/18/2012 11:22 AM   Modules accepted: Orders

## 2012-06-15 ENCOUNTER — Encounter: Payer: Self-pay | Admitting: Obstetrics and Gynecology

## 2012-06-15 ENCOUNTER — Other Ambulatory Visit (HOSPITAL_COMMUNITY)
Admission: RE | Admit: 2012-06-15 | Discharge: 2012-06-15 | Disposition: A | Discharge status: Home / Self Care | Payer: BC Managed Care – PPO | Source: Ambulatory Visit | Attending: Obstetrics and Gynecology | Admitting: Obstetrics and Gynecology

## 2012-06-15 ENCOUNTER — Ambulatory Visit (INDEPENDENT_AMBULATORY_CARE_PROVIDER_SITE_OTHER): Payer: BC Managed Care – PPO | Admitting: Obstetrics and Gynecology

## 2012-06-15 VITALS — BP 130/80 | Ht 65.0 in | Wt 125.0 lb

## 2012-06-15 DIAGNOSIS — Z01419 Encounter for gynecological examination (general) (routine) without abnormal findings: Secondary | ICD-10-CM

## 2012-06-15 DIAGNOSIS — Z1151 Encounter for screening for human papillomavirus (HPV): Secondary | ICD-10-CM | POA: Insufficient documentation

## 2012-06-15 DIAGNOSIS — N912 Amenorrhea, unspecified: Secondary | ICD-10-CM

## 2012-06-15 LAB — CBC WITH DIFFERENTIAL/PLATELET
HCT: 41.3 % (ref 36.0–46.0)
Hemoglobin: 14.6 g/dL (ref 12.0–15.0)
Lymphs Abs: 2.9 10*3/uL (ref 0.7–4.0)
Monocytes Relative: 10 % (ref 3–12)
Neutro Abs: 1.9 10*3/uL (ref 1.7–7.7)
Neutrophils Relative %: 35 % — ABNORMAL LOW (ref 43–77)
RBC: 4.75 MIL/uL (ref 3.87–5.11)

## 2012-06-15 LAB — FOLLICLE STIMULATING HORMONE: FSH: 78 m[IU]/mL

## 2012-06-15 NOTE — Patient Instructions (Signed)
Make appointment with Dr. Jamey Ripa. Discuss BRCA  testing with him.

## 2012-06-15 NOTE — Progress Notes (Signed)
Patient came to see me today for her annual GYN exam. She has not had a cycle since July. She is having some menopausal symptoms. She doesn't think they are severe enough to require her HRT. She is considering over-the-counter medication. She is having heart palpitations. She said her cardiologist and all was normal. He thought they could be menopausally related. She has a very strong family history of breast cancer including her mother and her grandmother. When her mother developed breast cancer she went for genetic testing because her mother's breast cancer was under the age of 75. They did not recommend BRCA1 and BRCA2 testing. She was recalled this year for mammogram for breast calcifications of the right breast and they are just watching them. She does however feel several areas in her left breast. She also had previous left breast biopsy at 10:00. Her mammogram this year was in September. She has always had normal Pap smears. Her last Pap was 2012. She now has a new partner as  she remarried. She is having no pelvic pain. She does have a history of fibroids.  Physical examination: HEENT within normal limits. Neck: Thyroid not large. No masses. Supraclavicular nodes: not enlarged. Breasts: Examined in both sitting and lying  position. No skin changes and no masses in right breast. In left breast I feel a firm nodule at 2 o'clock of .5 cm and a firm nodule at 5 o' clock of 2 cm. These were not addressed on mammography Abdomen: Soft no guarding rebound or masses or hernia. Pelvic: External: Within normal limits. BUS: Within normal limits. Vaginal:within normal limits. Good estrogen effect. No evidence of cystocele rectocele or enterocele. Cervix: clean. Uterus: 10 week fibroid uterus. Adnexa: No masses. Rectovaginal exam: Confirmatory and negative.  Assessment: #1. New breasts masses.( history of fibroadenoma of left  Breast). #2. Amenorrhea #3. Vasomotor symptoms #4. Endometriosis  Plan: Surgical  consult with Dr. Jamey Ripa. Asked her to revisit the issue of BRCA testing with him. FSH drawn. Bone density if elevated. Provera withdrawal if not. Discussed HRT. For the moment she will wait. Consider estrogen-serm when available next year . Pap with high risk HPV typing done. The new Pap smear guidelines were discussed with the patient.  Extremities: Within normal limits.

## 2012-06-16 ENCOUNTER — Other Ambulatory Visit: Payer: Self-pay | Admitting: *Deleted

## 2012-06-16 DIAGNOSIS — Z13820 Encounter for screening for osteoporosis: Secondary | ICD-10-CM

## 2012-06-16 LAB — URINALYSIS W MICROSCOPIC + REFLEX CULTURE
Bacteria, UA: NONE SEEN
Crystals: NONE SEEN
Ketones, ur: NEGATIVE mg/dL
Leukocytes, UA: NEGATIVE
Nitrite: NEGATIVE
Specific Gravity, Urine: 1.012 (ref 1.005–1.030)
Urobilinogen, UA: 0.2 mg/dL (ref 0.0–1.0)

## 2012-07-01 ENCOUNTER — Encounter (INDEPENDENT_AMBULATORY_CARE_PROVIDER_SITE_OTHER): Payer: Self-pay | Admitting: Surgery

## 2012-07-01 ENCOUNTER — Ambulatory Visit (INDEPENDENT_AMBULATORY_CARE_PROVIDER_SITE_OTHER): Payer: BC Managed Care – PPO | Admitting: Surgery

## 2012-07-01 VITALS — BP 102/58 | HR 64 | Temp 97.4°F | Resp 16 | Ht 65.0 in | Wt 127.2 lb

## 2012-07-01 DIAGNOSIS — R921 Mammographic calcification found on diagnostic imaging of breast: Secondary | ICD-10-CM

## 2012-07-01 DIAGNOSIS — N6009 Solitary cyst of unspecified breast: Secondary | ICD-10-CM

## 2012-07-01 NOTE — Progress Notes (Signed)
Patient ID: Monica English, female   DOB: 1961-07-08, 51 y.o.   MRN: [id number]  Chief Complaint  Patient presents with  . Breast Mass    eval lt br mass and rt br calcifications    HPI Monica English is a 51 y.o. female.  She comes over from her gynecologist office for evaluation of a newly found left breast mass and review some calcifications noted on her mammogram in September. She's had a history of cysts and masses. She's had a biopsy of one in the remote past. She is not having any new symptoms. She notes that her breasts have always been irregular. HPI  Past Medical History  Diagnosis Date  . Fibroid   . Fibroadenoma of breast     left  breast  . Cyst of breast     left  . Acid reflux   . Mitral valve prolapse   . Endometriosis     Past Surgical History  Procedure Date  . Tonsillectomy   . Dx lap with laser of endometrosis   . Pelvic laparoscopy   . Breast cyst aspiration     Family History  Problem Relation Age of Onset  . Hypertension Mother   . Breast cancer Mother     Age 74  . Cancer Mother     breast  . Breast cancer Maternal Grandmother     Age 56  . Cancer Maternal Grandmother     breast  . Heart disease Paternal Grandmother   . Heart disease Paternal Grandfather   . Colon cancer Other   . Cancer Other     colon  . Breast cancer Other     Age 40's  . Cancer Other     breast  . Cancer Maternal Aunt     Liver    Social History History  Substance Use Topics  . Smoking status: Never Smoker   . Smokeless tobacco: Never Used  . Alcohol Use: No    Allergies  Allergen Reactions  . Ciprofloxacin     hives    Current Outpatient Prescriptions  Medication Sig Dispense Refill  . ibuprofen (ADVIL,MOTRIN) 200 MG tablet Take 200 mg by mouth every 6 (six) hours as needed.        . Multiple Vitamins-Minerals (MULTI-VITAMIN MENOPAUSAL) TABS Take by mouth.      . ranitidine (ZANTAC) 150 MG tablet Take 75 mg by mouth daily.         Review of  Systems Review of Systems  Constitutional: Negative for fever, chills and unexpected weight change.  HENT: Negative for hearing loss, congestion, sore throat, trouble swallowing and voice change.   Eyes: Negative for visual disturbance.  Respiratory: Negative for cough and wheezing.   Cardiovascular: Positive for palpitations. Negative for chest pain and leg swelling.  Gastrointestinal: Negative for nausea, vomiting, abdominal pain, diarrhea, constipation, blood in stool, abdominal distention and anal bleeding.  Genitourinary: Negative for hematuria, vaginal bleeding and difficulty urinating.  Musculoskeletal: Negative for arthralgias.  Skin: Negative for rash and wound.  Neurological: Negative for seizures, syncope and headaches.  Hematological: Negative for adenopathy. Bruises/bleeds easily.  Psychiatric/Behavioral: Negative for confusion.    Blood pressure 102/58, pulse 64, temperature 97.4 F (36.3 C), temperature source Oral, resp. rate 16, height 5\' 5"  (1.651 m), weight 127 lb 3.2 oz (57.698 kg), last menstrual period 01/28/2012.  Physical Exam Physical Exam  Constitutional: She appears well-developed and well-nourished.  HENT:  Head: Normocephalic and atraumatic.  Pulmonary/Chest:  Small scar right breast, dominant mass left brear, lower outer quadrant, 2.5 cm, well circumscribed by palpation, mobile, mild peri-areolar nodularity  Lymphadenopathy:    She has no axillary adenopathy.       Right: No supraclavicular adenopathy present.       Left: No supraclavicular adenopathy present.    Data Reviewed Reviwed the mammogram report - stable left breast, faint Ca++ in right. Did office ultrasound and two adjacent cysts noted in left at palpable area  Assessment    1. Breast macro-cysts 2. Breast Ca++, indeterminate    Plan    Will follow up Ca++ with mamm in March       Kamorie Aldous J 07/01/2012, 11:12 AM

## 2012-07-01 NOTE — Patient Instructions (Signed)
Have a followup mammogram in March as recommended by the radiologist from your September mammogram, If the custs become more bothersome come back to see me again

## 2012-07-08 ENCOUNTER — Ambulatory Visit (INDEPENDENT_AMBULATORY_CARE_PROVIDER_SITE_OTHER): Payer: BC Managed Care – PPO | Admitting: Family Medicine

## 2012-07-08 DIAGNOSIS — Z23 Encounter for immunization: Secondary | ICD-10-CM

## 2012-11-30 ENCOUNTER — Ambulatory Visit (INDEPENDENT_AMBULATORY_CARE_PROVIDER_SITE_OTHER): Payer: BC Managed Care – PPO | Admitting: Family Medicine

## 2012-11-30 ENCOUNTER — Encounter: Payer: Self-pay | Admitting: Family Medicine

## 2012-11-30 VITALS — BP 120/80 | Temp 98.4°F

## 2012-11-30 DIAGNOSIS — J0100 Acute maxillary sinusitis, unspecified: Secondary | ICD-10-CM

## 2012-11-30 MED ORDER — AMOXICILLIN 875 MG PO TABS: 875.0000 mg | ORAL_TABLET | Freq: Two times a day (BID) | ORAL | Status: DC

## 2012-11-30 NOTE — Progress Notes (Signed)
  Subjective:    Patient ID: Monica English, female    DOB: 1961/03/08, 52 y.o.   MRN: [id number]  HPI  2 week history of progressive sinus symptoms Frequent sore throat especially at night and early morning. She has had some bilaterally maxillary sinus fullness Denies any fever or chills. Increased malaise. She had something priest mostly nonproductive cough. Thick yellow to green nasal discharge. Generally does not have springtime allergies. Taken Sudafed with minimal relief. Previous intolerance to antihistamines. Allergy to Cipro  Past Medical History  Diagnosis Date  . Fibroid   . Fibroadenoma of breast     left  breast  . Cyst of breast     left  . Acid reflux   . Mitral valve prolapse   . Endometriosis    Past Surgical History  Procedure Laterality Date  . Tonsillectomy    . Dx lap with laser of endometrosis    . Pelvic laparoscopy    . Breast cyst aspiration      reports that she has never smoked. She has never used smokeless tobacco. She reports that she does not drink alcohol or use illicit drugs. family history includes Breast cancer in her maternal grandmother, mother, and other; Cancer in her maternal aunt, maternal grandmother, mother, and others; Colon cancer in her other; Heart disease in her paternal grandfather and paternal grandmother; and Hypertension in her mother. Allergies  Allergen Reactions  . Ciprofloxacin     hives     Review of Systems  Constitutional: Positive for fatigue. Negative for fever and chills.  HENT: Positive for congestion, sore throat and sinus pressure.   Respiratory: Positive for cough.        Objective:   Physical Exam  Constitutional: She appears well-developed and well-nourished.  HENT:  Right Ear: External ear normal.  Left Ear: External ear normal.  Mouth/Throat: Oropharynx is clear and moist.  Neck: Neck supple.  Cardiovascular: Normal rate and regular rhythm.   Pulmonary/Chest: Effort normal and breath sounds  normal. No respiratory distress. She has no wheezes. She has no rales.  Lymphadenopathy:    She has no cervical adenopathy.          Assessment & Plan:  Acute sinusitis. Given duration of symptoms start amoxicillin 875 mg twice daily for 10 days. Consider saline nasal irrigation. Touch base one to 2 weeks if no improvement. Consider Singulair or nasal steroid if not improving with the above

## 2012-11-30 NOTE — Patient Instructions (Addendum)

## 2012-12-14 ENCOUNTER — Telehealth: Payer: Self-pay | Admitting: *Deleted

## 2012-12-14 NOTE — Telephone Encounter (Signed)
INFORMED PT TO CALL [facility] TO SCHEDULE HER FOLLOW UP VISIT WITH [facility], [facility] SENT [place] A LETTER STATING PT NEVER RETURNED FOR FOLLOW UP VISIT. PT PROMISED ME SHE WOULD CALL TODAY.

## 2012-12-26 ENCOUNTER — Encounter: Payer: Self-pay | Admitting: Gynecology

## 2013-06-02 ENCOUNTER — Ambulatory Visit (INDEPENDENT_AMBULATORY_CARE_PROVIDER_SITE_OTHER): Payer: BC Managed Care – PPO | Admitting: Family Medicine

## 2013-06-02 ENCOUNTER — Encounter: Payer: Self-pay | Admitting: Family Medicine

## 2013-06-02 ENCOUNTER — Ambulatory Visit (INDEPENDENT_AMBULATORY_CARE_PROVIDER_SITE_OTHER)
Admission: RE | Admit: 2013-06-02 | Discharge: 2013-06-02 | Disposition: A | Discharge status: Home / Self Care | Payer: BC Managed Care – PPO | Source: Ambulatory Visit | Attending: Family Medicine | Admitting: Family Medicine

## 2013-06-02 VITALS — BP 112/70 | HR 60 | Temp 97.9°F | Wt 130.0 lb

## 2013-06-02 DIAGNOSIS — M25562 Pain in left knee: Secondary | ICD-10-CM

## 2013-06-02 DIAGNOSIS — M25569 Pain in unspecified knee: Secondary | ICD-10-CM

## 2013-06-02 DIAGNOSIS — Z23 Encounter for immunization: Secondary | ICD-10-CM

## 2013-06-02 NOTE — Progress Notes (Signed)
  Subjective:    Patient ID: Monica English, female    DOB: [birth year], 52 y.o.   MRN: [id number]  HPI Is seen for acute visit Knee pain. Onset around September. No specific injury. She describes intermittent pain which is sometimes severe up to 8/10 and sharp quality. Location is left lateral knee near joint space. She has not noted any erythema or skin changes. No definite effusion. She's taken ibuprofen with mild relief. No locking or giving way. No prior history of knee difficulties. Exacerbated by local palpation or if she bumps her knee. Generally walking without difficulty. Pain has been persistent since September and not improving over this time. No other alleviating or aggravating circumstances.  Past Medical History  Diagnosis Date  . Fibroid   . Fibroadenoma of breast     left  breast  . Cyst of breast     left  . Acid reflux   . Mitral valve prolapse   . Endometriosis    Past Surgical History  Procedure Laterality Date  . Tonsillectomy    . Dx lap with laser of endometrosis    . Pelvic laparoscopy    . Breast cyst aspiration      reports that she has never smoked. She has never used smokeless tobacco. She reports that she does not drink alcohol or use illicit drugs. family history includes Breast cancer in her maternal grandmother, mother, and other; Cancer in her maternal aunt, maternal grandmother, mother, other, and other; Colon cancer in her other; Heart disease in her paternal grandfather and paternal grandmother; Hypertension in her mother. Allergies  Allergen Reactions  . Ciprofloxacin     hives       Review of Systems  Neurological: Negative for weakness and numbness.       Objective:   Physical Exam  Constitutional: She appears well-developed and well-nourished.  Cardiovascular: Normal rate and regular rhythm.   Pulmonary/Chest: Effort normal and breath sounds normal. No respiratory distress. She has no wheezes. She has no rales.  Musculoskeletal:   Left knee reveals full range of motion. No effusion. No erythema. No warmth. She has some mild tenderness palpating the lateral joint space. No medial tenderness. Ligament testing is normal. No popliteal swelling. No skin changes          Assessment & Plan:  Left lateral knee pain. Question medial meniscal. Does not correspond to location for ITB syndrome.  Given duration, obtain plain x-rays to start.

## 2013-09-08 ENCOUNTER — Encounter: Payer: Self-pay | Admitting: Family Medicine

## 2013-09-08 ENCOUNTER — Ambulatory Visit (INDEPENDENT_AMBULATORY_CARE_PROVIDER_SITE_OTHER): Payer: BC Managed Care – PPO | Admitting: Family Medicine

## 2013-09-08 VITALS — BP 118/72 | HR 71 | Temp 97.7°F | Wt 134.0 lb

## 2013-09-08 DIAGNOSIS — M549 Dorsalgia, unspecified: Secondary | ICD-10-CM

## 2013-09-08 LAB — POCT URINALYSIS DIPSTICK
Bilirubin, UA: NEGATIVE
Glucose, UA: NEGATIVE
KETONES UA: NEGATIVE
Nitrite, UA: NEGATIVE
PH UA: 5.5
PROTEIN UA: NEGATIVE
RBC UA: NEGATIVE
SPEC GRAV UA: 1.015
Urobilinogen, UA: 0.2

## 2013-09-08 MED ORDER — METAXALONE 800 MG PO TABS: 800.0000 mg | ORAL_TABLET | Freq: Three times a day (TID) | ORAL | Status: DC

## 2013-09-08 NOTE — Progress Notes (Signed)
   Subjective:    Patient ID: Monica English, female    DOB: 07-Nov-1960, 53 y.o.   MRN: [id number]  Back Pain Pertinent negatives include no abdominal pain, dysuria, fever, numbness or weakness.   Patient seen with right lumbar back pain. Onset about one week ago. Denies specific injury. 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 Her the patient's right lower lumbar. No radiculopathy symptoms. She describes a dull ache which is worse with twisting and bending. No numbness or weakness lower extremity. No urine or stool incontinence. Pain is moderate at times. She's tried ibuprofen with intermittent relief. Heat without much relief. No prior history of back difficulties  Past Medical History  Diagnosis Date  . Fibroid   . Fibroadenoma of breast     left  breast  . Cyst of breast     left  . Acid reflux   . Mitral valve prolapse   . Endometriosis    Past Surgical History  Procedure Laterality Date  . Tonsillectomy    . Dx lap with laser of endometrosis    . Pelvic laparoscopy    . Breast cyst aspiration      reports that she has never smoked. She has never used smokeless tobacco. She reports that she does not drink alcohol or use illicit drugs. family history includes Breast cancer in her maternal grandmother, mother, and other; Cancer in her maternal aunt, maternal grandmother, mother, other, and other; Colon cancer in her other; Heart disease in her paternal grandfather and paternal grandmother; Hypertension in her mother. Allergies  Allergen Reactions  . Ciprofloxacin     hives      Review of Systems  Constitutional: Negative for fever and chills.  Gastrointestinal: Negative for abdominal pain.  Genitourinary: Negative for dysuria.  Musculoskeletal: Positive for back pain.  Neurological: Negative for weakness and numbness.       Objective:   Physical Exam  Constitutional: She appears well-developed  and well-nourished.  Cardiovascular: Normal rate and regular rhythm.   Pulmonary/Chest: Effort normal and breath sounds normal. No respiratory distress. She has no wheezes. She has no rales.  Musculoskeletal: She exhibits no edema.  Straight leg raises are negative She has some mild tenderness right lower lumbar region to palpation. She has good range of motion with flexion and extension  Neurological:  Full-strength lower extremities. Symmetric reflexes ankle and knee          Assessment & Plan:  Musculoskeletal strain right lumbar spine. Continue heat or ice for symptomatic relief. Extension stretches reviewed. Continue ibuprofen. Skelaxin 800 mg every 8 hours as needed for muscle spasm. Consider physical therapy if no better couple weeks

## 2013-09-08 NOTE — Patient Instructions (Signed)

## 2013-09-08 NOTE — Progress Notes (Signed)
Pre visit review using our clinic review tool, if applicable. No additional management support is needed unless otherwise documented below in the visit note. 

## 2014-01-19 IMAGING — CR DG KNEE COMPLETE 4+V*L*
4 series · 4 of 4 positions shown · non-contrast
Comparison: None.

CLINICAL DATA: Left knee pain for 2 months. Patient unable to bear
weight.

EXAM:
LEFT KNEE - COMPLETE 4+ VIEW

[view not recorded (1 of 4)]
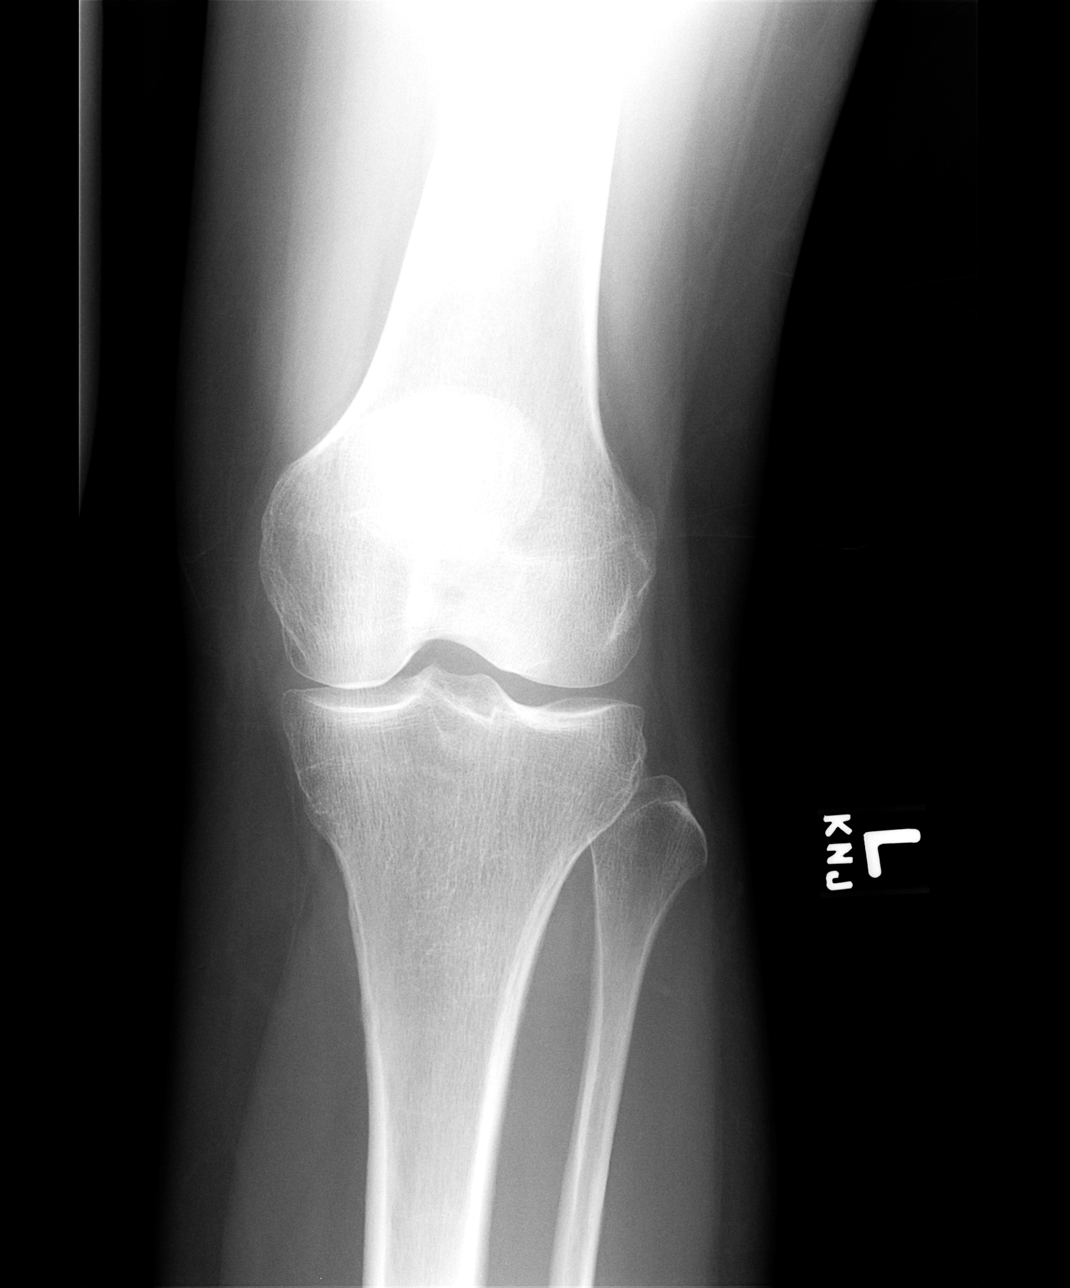

[view not recorded (2 of 4)]
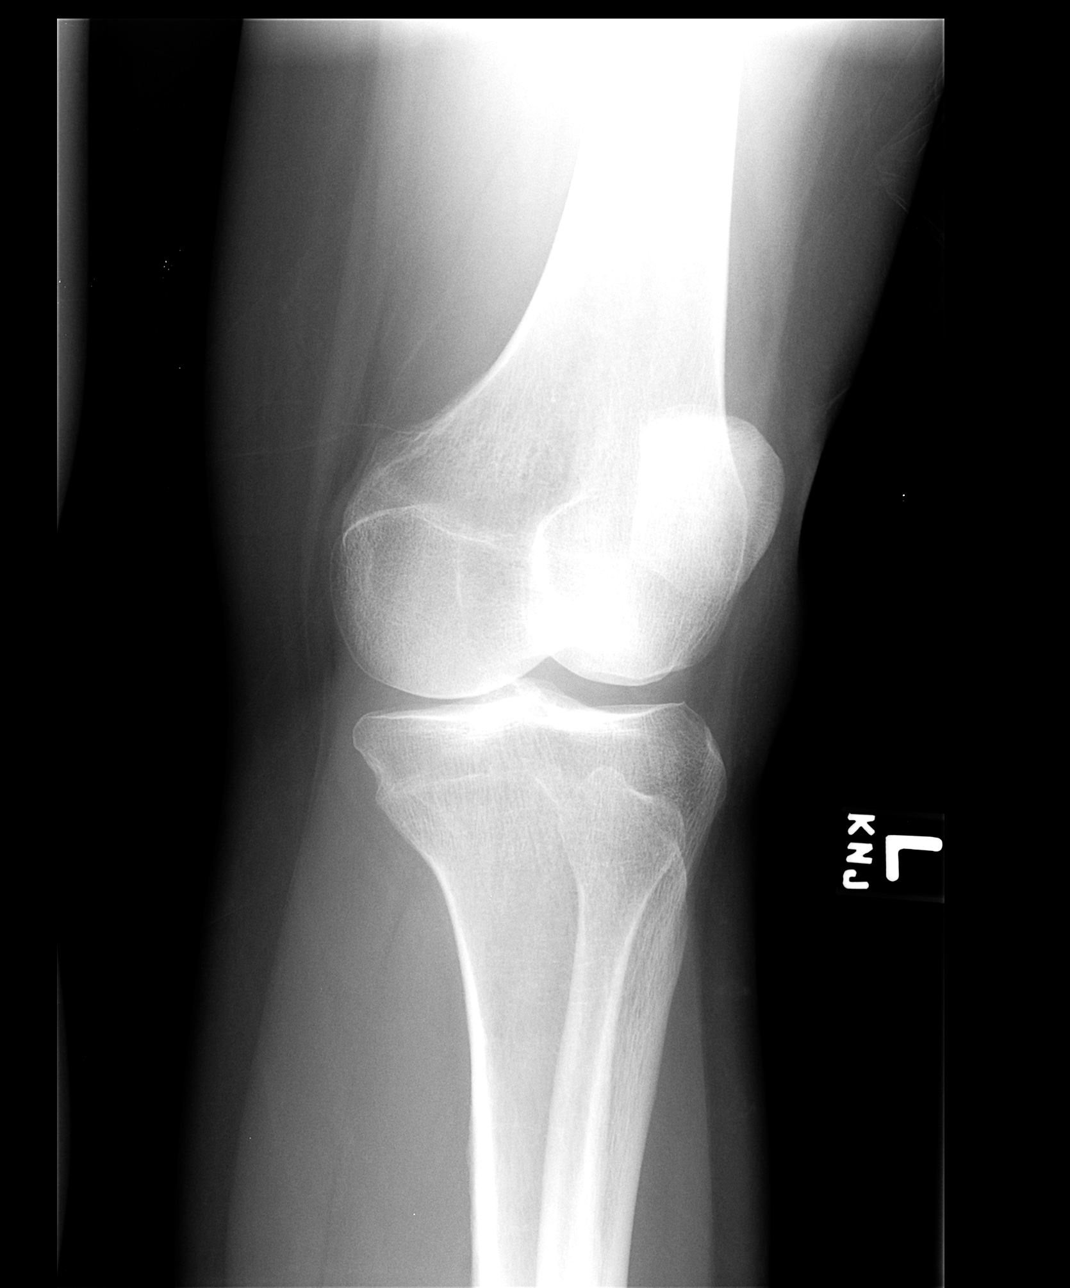

[view not recorded (3 of 4)]
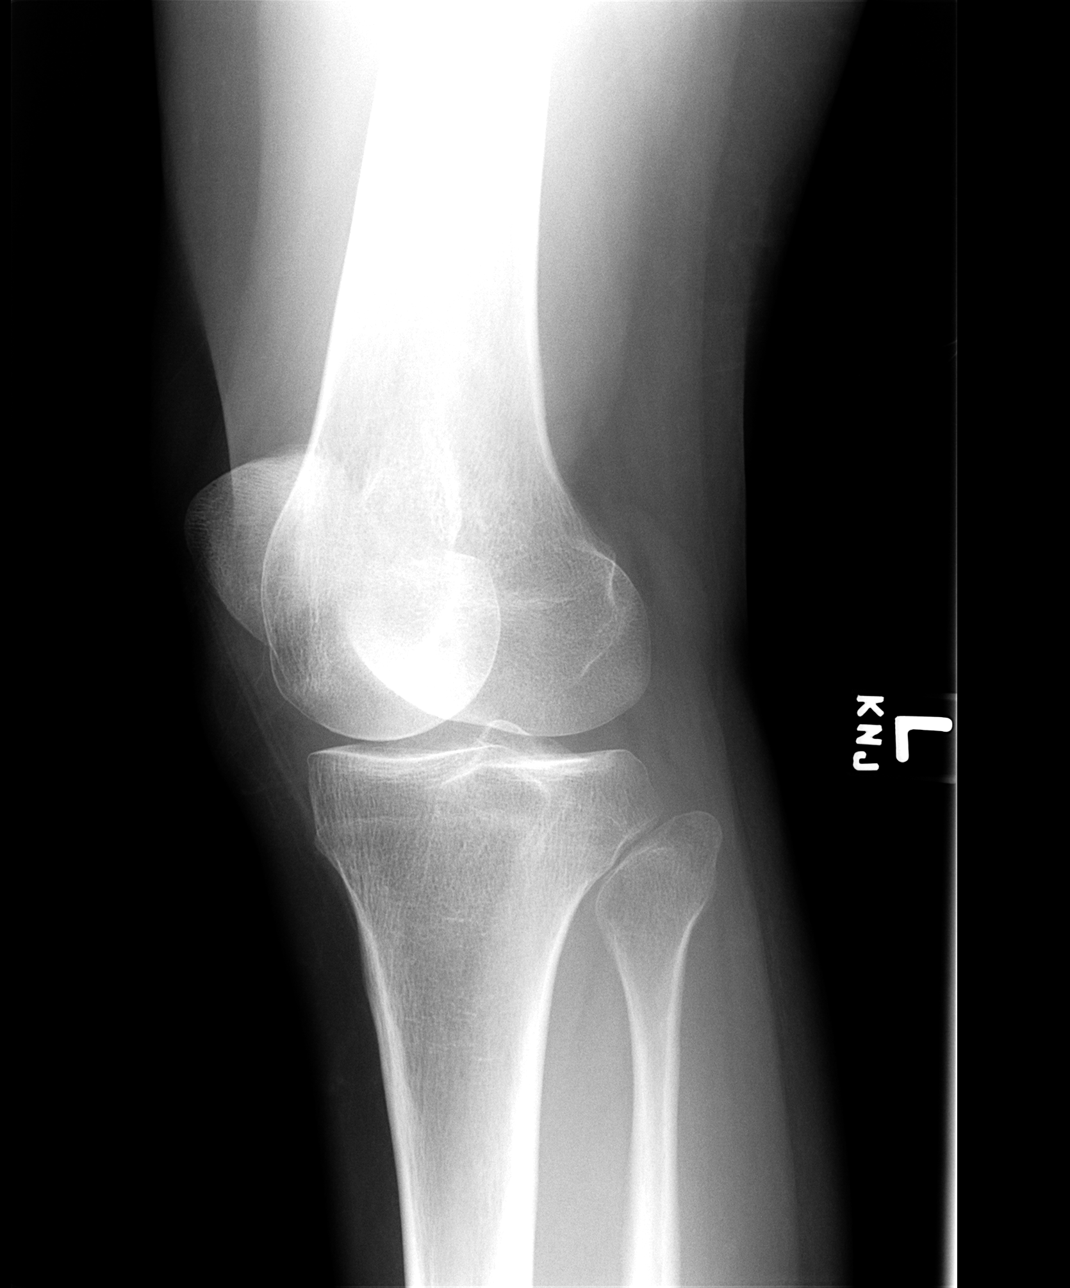

[view not recorded (4 of 4)]
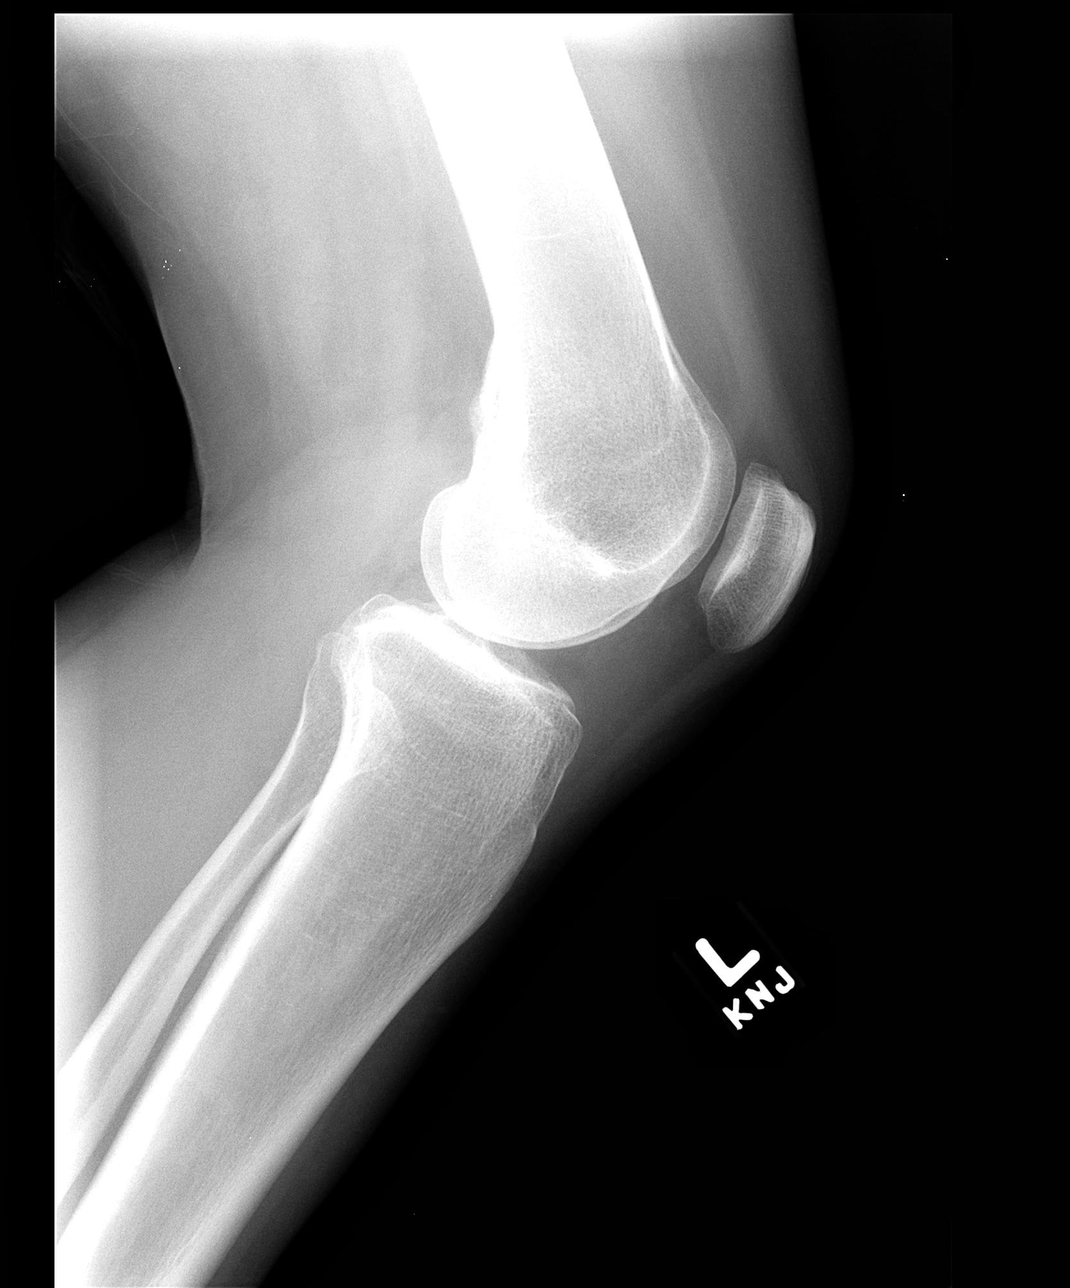

[4 of 4 positions shown; findings below may reference images not displayed]

FINDINGS: There is no evidence of fracture, dislocation, or joint effusion.
There is no evidence of arthropathy or other focal bone abnormality.
Soft tissues are unremarkable.
IMPRESSION: No acute osseous injury of the left knee.

## 2014-04-25 ENCOUNTER — Ambulatory Visit: Payer: BC Managed Care – PPO

## 2014-04-26 ENCOUNTER — Ambulatory Visit: Payer: Self-pay | Admitting: Gynecology

## 2014-04-26 ENCOUNTER — Ambulatory Visit: Payer: BC Managed Care – PPO | Admitting: Family Medicine

## 2014-05-01 ENCOUNTER — Ambulatory Visit (INDEPENDENT_AMBULATORY_CARE_PROVIDER_SITE_OTHER): Payer: BC Managed Care – PPO | Admitting: Family Medicine

## 2014-05-01 DIAGNOSIS — Z23 Encounter for immunization: Secondary | ICD-10-CM

## 2014-06-08 ENCOUNTER — Ambulatory Visit: Payer: Self-pay | Admitting: Gynecology

## 2014-07-30 ENCOUNTER — Ambulatory Visit (INDEPENDENT_AMBULATORY_CARE_PROVIDER_SITE_OTHER): Payer: BLUE CROSS/BLUE SHIELD | Admitting: Gynecology

## 2014-07-30 ENCOUNTER — Encounter: Payer: Self-pay | Admitting: Gynecology

## 2014-07-30 ENCOUNTER — Other Ambulatory Visit (HOSPITAL_COMMUNITY)
Admission: RE | Admit: 2014-07-30 | Discharge: 2014-07-30 | Disposition: A | Discharge status: Home / Self Care | Payer: BLUE CROSS/BLUE SHIELD | Source: Ambulatory Visit | Attending: Gynecology | Admitting: Gynecology

## 2014-07-30 VITALS — BP 120/76 | Ht 64.0 in | Wt 137.0 lb

## 2014-07-30 DIAGNOSIS — Z01419 Encounter for gynecological examination (general) (routine) without abnormal findings: Secondary | ICD-10-CM | POA: Insufficient documentation

## 2014-07-30 DIAGNOSIS — N951 Menopausal and female climacteric states: Secondary | ICD-10-CM

## 2014-07-30 DIAGNOSIS — D251 Intramural leiomyoma of uterus: Secondary | ICD-10-CM

## 2014-07-30 LAB — LIPID PANEL
CHOL/HDL RATIO: 3.4 ratio
Cholesterol: 202 mg/dL — ABNORMAL HIGH (ref 0–200)
HDL: 59 mg/dL (ref >39)
LDL CALC: 128 mg/dL — AB (ref 0–99)
Triglycerides: 77 mg/dL (ref <150)
VLDL: 15 mg/dL (ref 0–40)

## 2014-07-30 LAB — COMPREHENSIVE METABOLIC PANEL
ALK PHOS: 56 U/L (ref 39–117)
ALT: 22 U/L (ref 0–35)
AST: 17 U/L (ref 0–37)
Albumin: 4.4 g/dL (ref 3.5–5.2)
BILIRUBIN TOTAL: 0.6 mg/dL (ref 0.2–1.2)
BUN: 16 mg/dL (ref 6–23)
CHLORIDE: 103 meq/L (ref 96–112)
CO2: 25 mEq/L (ref 19–32)
Calcium: 9.1 mg/dL (ref 8.4–10.5)
Creat: 0.8 mg/dL (ref 0.50–1.10)
Glucose, Bld: 84 mg/dL (ref 70–99)
POTASSIUM: 4.3 meq/L (ref 3.5–5.3)
Sodium: 140 mEq/L (ref 135–145)
Total Protein: 6.8 g/dL (ref 6.0–8.3)

## 2014-07-30 LAB — TSH: TSH: 1.248 u[IU]/mL (ref 0.350–4.500)

## 2014-07-30 NOTE — Progress Notes (Signed)
Monica English [birth year] [id number]        54 y.o.  G0P0 for annual exam.  Former patient of Dr. Cherylann Banas. Several issues noted below.  Past medical history,surgical history, problem list, medications, allergies, family history and social history were all reviewed and documented as reviewed in the EPIC chart.  ROS:  Performed with pertinent positives and negatives included in the history, assessment and plan.   Additional significant findings :  none   Exam: Kim Counsellor Vitals:   07/30/14 0926  BP: 120/76  Height: 5\' 4"  (1.626 m)  Weight: 137 lb (62.143 kg)   General appearance:  Normal affect, orientation and appearance. Skin: Grossly normal HEENT: Without gross lesions.  No cervical or supraclavicular adenopathy. Thyroid normal.  Lungs:  Clear without wheezing, rales or rhonchi Cardiac: RR, without RMG Abdominal:  Soft, nontender, without masses, guarding, rebound, organomegaly or hernia Breasts:  Examined lying and sitting without masses, retractions, discharge or axillary adenopathy. Pelvic:  Ext/BUS/vagina normal with mild atrophic changes  Cervix with mild atrophic changes. Pap smear done  Uterus 8 weeks' size irregular consistent with history of leiomyoma, midline and mobile nontender   Adnexa  Without masses or tenderness    Anus and perineum  Normal   Rectovaginal  Normal sphincter tone without palpated masses or tenderness.    Assessment/Plan:  54 y.o. [name] for annual exam.   1. Postmenopausal. Patient having some hot flushes. Tried soy based products previously but felt that her blood pressure had decreased and she stopped it. Options to include observation, we attempted soy based products, HRT, non-hormonal pharmacologic such as Effexor. Patient not interested at this point but prefers just to monitor. No vaginal dryness or dyspareunia. No vaginal bleeding. Call if any vaginal bleeding or wants to rediscuss treatment options. 2. Leiomyoma. Exam by Dr.  Cherylann Banas 2013 showed uterus ten-week size. Feels smaller today at 8 weeks size. Ultrasound has shown multiple leiomyoma in the past the largest measuring 56 mm. Patient asymptomatic. We'll continue to monitor. 3. Pap smear/HPV negative 2013. Pap smear done today. No history of significant abnormal Pap smears. Will plan less frequent screening intervals per current screening guidelines assuming this Pap smear is normal. 4. Mammography 11/2012. Patient overdue for mammography and I strongly recommended she schedule when she agrees to do so. I recommended a 3-D screening given her family history to include mother and maternal grandmother with breast cancer. Per Dr. Valeta Harms note they had discussed genetic testing in the past but was not recommended. SBE monthly reviewed. 5. Colonoscopy never. Recommended screening colonoscopy as she is over the age of 42. Names and numbers provided. 6. DEXA never. Will plan further into the menopause. Increase calcium vitamin D reviewed. Check vitamin D level today. 7. Health maintenance. Patient requests screening blood work.  She does have a primary physician but has not seen him in over a year.   CBC comprehensive metabolic panel lipid profile urinalysis TSH vitamin D ordered. Follow up 1 year, sooner as needed.     Anastasio Auerbach MD, 10:00 AM 07/30/2014

## 2014-07-30 NOTE — Addendum Note (Signed)
Addended by: Nelva Nay on: 07/30/2014 10:19 AM   Modules accepted: Orders, SmartSet

## 2014-07-30 NOTE — Patient Instructions (Addendum)
Schedule colonoscopy with [facility] at [phone] or [facility] at [phone]  Call to Schedule your mammogram  Facilities in [place]: 1)  [facility], [address] Phone: [phone])  [facility], [address] Phone: [phone])  Dr. Bertrand at [facility]  [address] Phone: [phone]     Mammogram A mammogram is an X-ray test to find changes in a woman's breast. You should get a mammogram if:  You are 40 years of age or older  You have risk factors.   Your doctor recommends that you have one.  BEFORE THE TEST  Do not schedule the test the week before your period, especially if your breasts are sore during this time.  On the day of your mammogram:  Wash your breasts and armpits well. After washing, do not put on any deodorant or talcum powder on until after your test.   Eat and drink as you usually do.   Take your medicines as usual.   If you are diabetic and take insulin, make sure you:   Eat before coming for your test.   Take your insulin as usual.   If you cannot keep your appointment, call before the appointment to cancel. Schedule another appointment.  TEST  You will need to undress from the waist up. You will put on a hospital gown.   Your breast will be put on the mammogram machine, and it will press firmly on your breast with a piece of plastic called a compression paddle. This will make your breast flatter so that the machine can X-ray all parts of your breast.   Both breasts will be X-rayed. Each breast will be X-rayed from above and from the side. An X-ray might need to be taken again if the picture is not good enough.   The mammogram will last about 15 to 30 minutes.  AFTER THE TEST Finding out the results of your test Ask when your test results will be ready. Make sure you get your test  results.  Document Released: 10/09/2008 Document Revised: 07/02/2011 Document Reviewed: 10/09/2008 [employer] Patient Information 2012 [employer].  You may obtain a copy of any labs that were done today by logging onto MyChart as outlined in the instructions provided with your AVS (after visit summary). The office will not call with normal lab results but certainly if there are any significant abnormalities then we will contact you.   Health Maintenance, Female A healthy lifestyle and preventative care can promote health and wellness.  Maintain regular health, dental, and eye exams.  Eat a healthy diet. Foods like vegetables, fruits, whole grains, low-fat dairy products, and lean protein foods contain the nutrients you need without too many calories. Decrease your intake of foods high in solid fats, added sugars, and salt. Get information about a proper diet from your caregiver, if necessary.  Regular physical exercise is one of the most important things you can do for your health. Most adults should get at least 150 minutes of moderate-intensity exercise (any activity that increases your heart rate and causes you to sweat) each week. In addition, most adults need muscle-strengthening exercises on 2 or more days a week.   Maintain a healthy weight. The body mass index (BMI) is a screening tool to identify possible weight problems. It provides an estimate of body fat based on height and weight. Your caregiver can help determine your   BMI, and can help you achieve or maintain a healthy weight. For adults 20 years and older:  A BMI below 18.5 is considered underweight.  A BMI of 18.5 to 24.9 is normal.  A BMI of 25 to 29.9 is considered overweight.  A BMI of 30 and above is considered obese.  Maintain normal blood lipids and cholesterol by exercising and minimizing your intake of saturated fat. Eat a balanced diet with plenty of fruits and vegetables. Blood tests for lipids and cholesterol  should begin at age 20 and be repeated every 5 years. If your lipid or cholesterol levels are high, you are over 50, or you are a high risk for heart disease, you may need your cholesterol levels checked more frequently.Ongoing high lipid and cholesterol levels should be treated with medicines if diet and exercise are not effective.  If you smoke, find out from your caregiver how to quit. If you do not use tobacco, do not start.  Lung cancer screening is recommended for adults aged 55 80 years who are at high risk for developing lung cancer because of a history of smoking. Yearly low-dose computed tomography (CT) is recommended for people who have at least a 30-pack-year history of smoking and are a current smoker or have quit within the past 15 years. A pack year of smoking is smoking an average of 1 pack of cigarettes a day for 1 year (for example: 1 pack a day for 30 years or 2 packs a day for 15 years). Yearly screening should continue until the smoker has stopped smoking for at least 15 years. Yearly screening should also be stopped for people who develop a health problem that would prevent them from having lung cancer treatment.  If you are pregnant, do not drink alcohol. If you are breastfeeding, be very cautious about drinking alcohol. If you are not pregnant and choose to drink alcohol, do not exceed 1 drink per day. One drink is considered to be 12 ounces (355 mL) of beer, 5 ounces (148 mL) of wine, or 1.5 ounces (44 mL) of liquor.  Avoid use of street drugs. Do not share needles with anyone. Ask for help if you need support or instructions about stopping the use of drugs.  High blood pressure causes heart disease and increases the risk of stroke. Blood pressure should be checked at least every 1 to 2 years. Ongoing high blood pressure should be treated with medicines, if weight loss and exercise are not effective.  If you are 55 to 53 years old, ask your caregiver if you should take aspirin  to prevent strokes.  Diabetes screening involves taking a blood sample to check your fasting blood sugar level. This should be done once every 3 years, after age 45, if you are within normal weight and without risk factors for diabetes. Testing should be considered at a younger age or be carried out more frequently if you are overweight and have at least 1 risk factor for diabetes.  Breast cancer screening is essential preventative care for women. You should practice "breast self-awareness." This means understanding the normal appearance and feel of your breasts and may include breast self-examination. Any changes detected, no matter how small, should be reported to a caregiver. Women in their 20s and 30s should have a clinical breast exam (CBE) by a caregiver as part of a regular health exam every 1 to 3 years. After age 40, women should have a CBE every year. Starting at age 40, women   should consider having a mammogram (breast X-ray) every year. Women who have a family history of breast cancer should talk to their caregiver about genetic screening. Women at a high risk of breast cancer should talk to their caregiver about having an MRI and a mammogram every year.  Breast cancer gene (BRCA)-related cancer risk assessment is recommended for women who have family members with BRCA-related cancers. BRCA-related cancers include breast, ovarian, tubal, and peritoneal cancers. Having family members with these cancers may be associated with an increased risk for harmful changes (mutations) in the breast cancer genes BRCA1 and BRCA2. Results of the assessment will determine the need for genetic counseling and BRCA1 and BRCA2 testing.  The Pap test is a screening test for cervical cancer. Women should have a Pap test starting at age 21. Between ages 21 and 29, Pap tests should be repeated every 2 years. Beginning at age 30, you should have a Pap test every 3 years as long as the past 3 Pap tests have been normal. If  you had a hysterectomy for a problem that was not cancer or a condition that could lead to cancer, then you no longer need Pap tests. If you are between ages 65 and 70, and you have had normal Pap tests going back 10 years, you no longer need Pap tests. If you have had past treatment for cervical cancer or a condition that could lead to cancer, you need Pap tests and screening for cancer for at least 20 years after your treatment. If Pap tests have been discontinued, risk factors (such as a new sexual partner) need to be reassessed to determine if screening should be resumed. Some women have medical problems that increase the chance of getting cervical cancer. In these cases, your caregiver may recommend more frequent screening and Pap tests.  The human papillomavirus (HPV) test is an additional test that may be used for cervical cancer screening. The HPV test looks for the virus that can cause the cell changes on the cervix. The cells collected during the Pap test can be tested for HPV. The HPV test could be used to screen women aged 30 years and older, and should be used in women of any age who have unclear Pap test results. After the age of 30, women should have HPV testing at the same frequency as a Pap test.  Colorectal cancer can be detected and often prevented. Most routine colorectal cancer screening begins at the age of 50 and continues through age 75. However, your caregiver may recommend screening at an earlier age if you have risk factors for colon cancer. On a yearly basis, your caregiver may provide home test kits to check for hidden blood in the stool. Use of a small camera at the end of a tube, to directly examine the colon (sigmoidoscopy or colonoscopy), can detect the earliest forms of colorectal cancer. Talk to your caregiver about this at age 50, when routine screening begins. Direct examination of the colon should be repeated every 5 to 10 years through age 75, unless early forms of  pre-cancerous polyps or small growths are found.  Hepatitis C blood testing is recommended for all people born from 1945 through 1965 and any individual with known risks for hepatitis C.  Practice safe sex. Use condoms and avoid high-risk sexual practices to reduce the spread of sexually transmitted infections (STIs). Sexually active women aged 25 and younger should be checked for Chlamydia, which is a common sexually transmitted infection. Older women   common sexually transmitted infection. Older women with new or multiple partners should also be tested for Chlamydia. Testing for other STIs is recommended if you are sexually active and at increased risk.  Osteoporosis is a disease in which the bones lose minerals and strength with aging. This can result in serious bone fractures. The risk of osteoporosis can be identified using a bone density scan. Women ages 62 and over and women at risk for fractures or osteoporosis should discuss screening with their caregivers. Ask your caregiver whether you should be taking a calcium supplement or vitamin D to reduce the rate of osteoporosis.  Menopause can be associated with physical symptoms and risks. Hormone replacement therapy is available to decrease symptoms and risks. You should talk to your caregiver about whether hormone replacement therapy is right for you.  Use sunscreen. Apply sunscreen liberally and repeatedly throughout the day. You should seek shade when your shadow is shorter than you. Protect yourself by wearing long sleeves, pants, a wide-brimmed hat, and sunglasses year round, whenever you are outdoors.  Notify your caregiver of new moles or changes in moles, especially if there is a change in shape or color. Also notify your caregiver if a mole is larger than the size of a pencil eraser.  Stay current with your immunizations. Document Released: 01/26/2011 Document Revised: 11/07/2012 Document Reviewed: 01/26/2011 [facility] Patient Information 2014 [place].

## 2014-07-31 ENCOUNTER — Other Ambulatory Visit: Payer: Self-pay | Admitting: Gynecology

## 2014-07-31 DIAGNOSIS — E559 Vitamin D deficiency, unspecified: Secondary | ICD-10-CM

## 2014-07-31 DIAGNOSIS — E7800 Pure hypercholesterolemia, unspecified: Secondary | ICD-10-CM

## 2014-07-31 LAB — URINALYSIS W MICROSCOPIC + REFLEX CULTURE
Bacteria, UA: NONE SEEN
Bilirubin Urine: NEGATIVE
CRYSTALS: NONE SEEN
Casts: NONE SEEN
Glucose, UA: NEGATIVE mg/dL
Hgb urine dipstick: NEGATIVE
Ketones, ur: NEGATIVE mg/dL
Leukocytes, UA: NEGATIVE
NITRITE: NEGATIVE
PROTEIN: NEGATIVE mg/dL
SQUAMOUS EPITHELIAL / LPF: NONE SEEN
Specific Gravity, Urine: 1.01 (ref 1.005–1.030)
UROBILINOGEN UA: 0.2 mg/dL (ref 0.0–1.0)
pH: 5.5 (ref 5.0–8.0)

## 2014-07-31 LAB — CBC WITH DIFFERENTIAL/PLATELET
Basophils Absolute: 0.1 10*3/uL (ref 0.0–0.1)
Basophils Relative: 1 % (ref 0–1)
EOS PCT: 2 % (ref 0–5)
Eosinophils Absolute: 0.1 10*3/uL (ref 0.0–0.7)
HCT: 41.9 % (ref 36.0–46.0)
Hemoglobin: 14.5 g/dL (ref 12.0–15.0)
LYMPHS ABS: 2.4 10*3/uL (ref 0.7–4.0)
LYMPHS PCT: 48 % — AB (ref 12–46)
MCH: 30.5 pg (ref 26.0–34.0)
MCHC: 34.6 g/dL (ref 30.0–36.0)
MCV: 88 fL (ref 78.0–100.0)
MONO ABS: 0.4 10*3/uL (ref 0.1–1.0)
MONOS PCT: 7 % (ref 3–12)
MPV: 11 fL (ref 8.6–12.4)
NEUTROS ABS: 2.1 10*3/uL (ref 1.7–7.7)
Neutrophils Relative %: 42 % — ABNORMAL LOW (ref 43–77)
Platelets: 173 10*3/uL (ref 150–400)
RBC: 4.76 MIL/uL (ref 3.87–5.11)
RDW: 14.2 % (ref 11.5–15.5)
WBC: 5.1 10*3/uL (ref 4.0–10.5)

## 2014-07-31 LAB — VITAMIN D 25 HYDROXY (VIT D DEFICIENCY, FRACTURES): Vit D, 25-Hydroxy: 16 ng/mL — ABNORMAL LOW (ref 30–100)

## 2014-07-31 LAB — CYTOLOGY - PAP

## 2014-08-28 ENCOUNTER — Telehealth: Payer: Self-pay | Admitting: Family Medicine

## 2014-08-28 NOTE — Telephone Encounter (Signed)
Make sure she has appt for follow up if symptoms persist.

## 2014-08-28 NOTE — Telephone Encounter (Signed)
FYI

## 2014-08-28 NOTE — Telephone Encounter (Signed)
Patient Name: Monica English  DOB: 11/23/1960    Initial Comment Caller states she has been nauseas all night and when she got up this morning she had a cramp in her upper right back. When she swallows it feels like there is something stuck in her throat. Also c/o acid reflux.    Nurse Assessment  Nurse: Thad Ranger RN, Denise Date/Time (Eastern Time): 08/28/2014 4:11:04 PM  Confirm and document reason for call. If symptomatic, describe symptoms. ---Caller states she has been nauseas all night and when she got up this morning she had a cramp in her upper right back. When she swallows it feels like there is something stuck in her throat. Also c/o acid reflux. Caller states her nausea has resolved but she continues to belch w/o spitting up. States she has some pain in between her shoulder blades that may be gas and it feels as if the pain is working it's way out. States when she swallows, she feels as if she has something in her esophagus that wont go down, but denies CP, dysphagia, and choking or recent choking episode. Taking Zantac 150mg  1/2 tab po bid (75mg  po bid) but states she only took 1/4 of a Zantac 150mg  tab this am. Sipping on clear fluids and eating soft/bland diet and tolerating well this afternoon. No fever. Denies dysuria.  Has the patient traveled out of the country within the last 30 days? ---Not Applicable  Does the patient require triage? ---Yes  Related visit to physician within the last 2 weeks? ---No  Does the PT have any chronic conditions? (i.e. diabetes, asthma, etc.) ---Yes  List chronic conditions. ---GERD  Did the patient indicate they were pregnant? ---No     Guidelines    Guideline Title Affirmed Question Affirmed Notes  Abdominal Pain - Upper Abdominal pain (all triage questions negative)    Final Disposition User   Enoch, RN, Langley Gauss

## 2014-08-29 NOTE — Telephone Encounter (Signed)
Patient called back and states she is feeling better and will call back for an appt if needed.

## 2014-08-29 NOTE — Telephone Encounter (Signed)
I left a message for the pt to return my call. 

## 2015-08-01 ENCOUNTER — Telehealth: Payer: Self-pay | Admitting: Family Medicine

## 2015-08-01 ENCOUNTER — Ambulatory Visit: Payer: BLUE CROSS/BLUE SHIELD

## 2015-08-01 NOTE — Telephone Encounter (Signed)
Patient has an appointment tomorrow 

## 2015-08-01 NOTE — Telephone Encounter (Signed)
Unable to leave a message due to the pts voicemail being full. 

## 2015-08-01 NOTE — Telephone Encounter (Signed)
Patient Name: Monica English DOB: 17-Aug-1960 Initial Comment Caller States she has tendintis and it is pulsating with her heart beat. raced for a bit. she is to have a flu shot at 10am. Nurse Assessment Nurse: Vallery Sa, RN, Tye [place] Date/Time (Eastern Time): 08/01/2015 9:06:33 AM Confirm and document reason for call. If symptomatic, describe symptoms. ---Caller states she developed racing heat rate this morning. She has tinnitus and noticed that her ears were pulsating. No severe breathing difficulty. Alert and responsive. No chest pain. No fever. Has the patient traveled out of the country within the last 30 days? ---No Does the patient have any new or worsening symptoms? ---Yes Will a triage be completed? ---Yes Related visit to physician within the last 2 weeks? ---Yes Does the PT have any chronic conditions? (i.e. diabetes, asthma, etc.) ---Yes List chronic conditions. ---Tinnitus Did the patient indicate they were pregnant? ---No Is this a behavioral health or substance abuse call? ---No Guidelines Guideline Title Affirmed Question Affirmed Notes Final Disposition User See PCP When Office is Open (within 3 days) [place], RN, [place] to See Within 24 Hours due to caller's concern. Scheduled for 9:15 am appointment with Dr. Maudie Mercury tomorrow Morning. She will wait and have the Flu shot tomorrow.  Referrals REFERRED TO PCP OFFICE

## 2015-08-01 NOTE — Telephone Encounter (Signed)
Please check on patient to make sure she is doing ok - if persistent heat concerns needs visit today if opening or with [facility] if we have no openings with [facility].

## 2015-08-02 ENCOUNTER — Ambulatory Visit (INDEPENDENT_AMBULATORY_CARE_PROVIDER_SITE_OTHER): Payer: BLUE CROSS/BLUE SHIELD | Admitting: Family Medicine

## 2015-08-02 ENCOUNTER — Encounter: Payer: Self-pay | Admitting: Family Medicine

## 2015-08-02 VITALS — BP 118/78 | HR 79 | Temp 97.7°F | Ht 64.0 in | Wt 138.6 lb

## 2015-08-02 DIAGNOSIS — Z23 Encounter for immunization: Secondary | ICD-10-CM

## 2015-08-02 DIAGNOSIS — R002 Palpitations: Secondary | ICD-10-CM | POA: Diagnosis not present

## 2015-08-02 DIAGNOSIS — H6593 Unspecified nonsuppurative otitis media, bilateral: Secondary | ICD-10-CM

## 2015-08-02 DIAGNOSIS — H9313 Tinnitus, bilateral: Secondary | ICD-10-CM

## 2015-08-02 NOTE — Progress Notes (Signed)
HPI:  Monica English is a 55 yo patient of Dr. Elease Hashimoto with a PMH significant for GERD and MVP here for an acute visit for:  Hearing heart beat/? Palpitations: -pt reports: has long hx of tinnitus and occ hears heart beat in ears - has had some nasal congestion and heard heart beat in ears yesterday briefly - this made her anxious and then she reports her HR was in low 100s briefly -denies: CP, SOB, DOE, frequent palpitations, hx CP/palpitations or dypnea with exercise, regular pulstile tinnitus, ear pain, fevers, malaise, hearing loss -wants flu shot -on review of chart she saw Dr. Johnsie Cancel for palpitations in 2013 and ? 2nd degree AV block, MVP - though cards notes suggest the abnormal EKG at PCP office was due to error in lead placement  ROS: See pertinent positives and negatives per HPI.  Past Medical History  Diagnosis Date  . Fibroadenoma of breast     left  breast  . Cyst of breast     left  . Acid reflux   . Mitral valve prolapse   . Endometriosis   . Leiomyoma     Past Surgical History  Procedure Laterality Date  . Tonsillectomy    . Breast cyst aspiration    . Pelvic laparoscopy  [id number]    endometriosis    Family History  Problem Relation Age of Onset  . Hypertension Mother   . Breast cancer Mother     Age 45  . Breast cancer Maternal Grandmother     Age 69  . Heart disease Paternal Grandmother   . Heart disease Paternal Grandfather   . Colon cancer Other   . Cancer Other     colon  . Breast cancer Other     Age 79's  . Cancer Other     breast  . Cancer Maternal Aunt     Liver    Social History   Social History  . Marital Status: Married    Spouse Name: N/A  . Number of Children: N/A  . Years of Education: N/A   Social History Main Topics  . Smoking status: Never Smoker   . Smokeless tobacco: Never Used  . Alcohol Use: No  . Drug Use: No  . Sexual Activity: Yes    Birth Control/ Protection: Other-see comments     Comment:  vasectomy--1st intercourse 55 yo-fewer than 5 partners   Other Topics Concern  . None   Social History Narrative     Current outpatient prescriptions:  .  ibuprofen (ADVIL,MOTRIN) 200 MG tablet, Take 200 mg by mouth every 6 (six) hours as needed.  , Disp: , Rfl:  .  ranitidine (ZANTAC) 150 MG tablet, Take 75 mg by mouth daily. , Disp: , Rfl:   EXAM:  Filed Vitals:   08/02/15 0932  BP: 118/78  Pulse: 79  Temp: 97.7 F (36.5 C)    Body mass index is 23.78 kg/(m^2).  GENERAL: vitals reviewed and listed above, alert, oriented, appears well hydrated and in no acute distress  HEENT: atraumatic, conjunttiva clear, no obvious abnormalities on inspection of external nose and ears, bilat clear effusion ears, mild clear rhinorrhea with PND  NECK: no obvious masses on inspection, no bruit  LUNGS: clear to auscultation bilaterally, no wheezes, rales or rhonchi, good air movement  CV: HRRR, no peripheral edema  MS: moves all extremities without noticeable abnormality  PSYCH: pleasant and cooperative, no obvious depression or anxiety  ASSESSMENT AND PLAN:  Discussed the following  assessment and plan: Spent > 25 minutes with this pt with > 50% in face to face counseling.  Middle ear effusion, bilateral Tinnitus, bilateral -treat effusion, no signs of infection -ENT eval for long standing tinnitus with ? pulsatile tinnitus at times - particularly if doe snot resolve with resolution of effusion -follow up in 1 month  Palpitations -this did not even sound like an episode of palpitations, but rather and anxiety induce mild tachycardia -EKG looked great today with NSR, no other symptoms so opted to monitor -follow up with PCP in 1 month, sooner if needed  -Patient advised to return or notify a doctor immediately if symptoms worsen or persist or new concerns arise.  Patient Instructions  BEFORE YOU LEAVE: -EKG -flu shot -follow up in 1 month with your doctor  AFRIN nasal  spray for 3 days then stop  Flonase 2 sprays each nostril for 21 days  Seek care if frequent palpitations, chest pain, trouble breathing or other concerns  Consider seeing Ear, nos and throat doctor regarding your tinnitus     Colin Benton R.

## 2015-08-02 NOTE — Progress Notes (Signed)
Pre visit review using our clinic review tool, if applicable. No additional management support is needed unless otherwise documented below in the visit note. 

## 2015-08-02 NOTE — Telephone Encounter (Signed)
Patient was seen by Dr Kim today. 

## 2015-08-02 NOTE — Patient Instructions (Signed)
BEFORE YOU LEAVE: -EKG -flu shot -follow up in 1 month with your doctor  AFRIN nasal spray for 3 days then stop  Flonase 2 sprays each nostril for 21 days  Seek care if frequent palpitations, chest pain, trouble breathing or other concerns  Consider seeing Ear, nos and throat doctor regarding your tinnitus

## 2015-09-18 ENCOUNTER — Ambulatory Visit (INDEPENDENT_AMBULATORY_CARE_PROVIDER_SITE_OTHER): Payer: BLUE CROSS/BLUE SHIELD | Admitting: Family Medicine

## 2015-09-18 VITALS — BP 114/82 | HR 75 | Temp 98.3°F | Ht 64.0 in | Wt 142.2 lb

## 2015-09-18 DIAGNOSIS — H66002 Acute suppurative otitis media without spontaneous rupture of ear drum, left ear: Secondary | ICD-10-CM | POA: Diagnosis not present

## 2015-09-18 MED ORDER — AMOXICILLIN 875 MG PO TABS: 875.0000 mg | ORAL_TABLET | Freq: Two times a day (BID) | ORAL | Status: DC

## 2015-09-18 NOTE — Patient Instructions (Signed)

## 2015-09-18 NOTE — Progress Notes (Signed)
Pre visit review using our clinic review tool, if applicable. No additional management support is needed unless otherwise documented below in the visit note. 

## 2015-09-18 NOTE — Progress Notes (Signed)
   Subjective:    Patient ID: Monica English, female    DOB: 1961-04-21, 55 y.o.   MRN: [id number]  HPI Acute visit. Patient is seen with upper respiratory symptoms onset about a week ago She started with laryngitis and also had some mild nasal congestion and productive cough. Has developed some left ear pain over the past few days. No fevers or chills. Taking Mucinex and Sudafed without much improvement Cough especially at night. Has not tried any cough medications yet. Denies any hearing loss. No vertigo. Overall feels fairly well  Past Medical History  Diagnosis Date  . Fibroadenoma of breast     left  breast  . Cyst of breast     left  . Acid reflux   . Mitral valve prolapse   . Endometriosis   . Leiomyoma    Past Surgical History  Procedure Laterality Date  . Tonsillectomy    . Breast cyst aspiration    . Pelvic laparoscopy  [id number]    endometriosis    reports that she has never smoked. She has never used smokeless tobacco. She reports that she does not drink alcohol or use illicit drugs. family history includes Breast cancer in her maternal grandmother, mother, and other; Cancer in her maternal aunt, other, and other; Colon cancer in her other; Heart disease in her paternal grandfather and paternal grandmother; Hypertension in her mother. Allergies  Allergen Reactions  . Ciprofloxacin     hives       Review of Systems  Constitutional: Negative for fever and chills.  HENT: Positive for congestion and ear pain. Negative for ear discharge and hearing loss.   Respiratory: Positive for cough. Negative for shortness of breath and wheezing.   Cardiovascular: Negative for chest pain.       Objective:   Physical Exam  Constitutional: She appears well-developed and well-nourished.  HENT:  Mild posterior pharynx erythema. Right eardrum is normal. Left eardrum reveals erythema especially along the top portion. She has some bulging but no perforation.  Neck: Neck  supple.  Cardiovascular: Normal rate and regular rhythm.   Pulmonary/Chest: Effort normal and breath sounds normal. No respiratory distress. She has no wheezes. She has no rales.  Lymphadenopathy:    She has no cervical adenopathy.          Assessment & Plan:  Acute left otitis media. Suspect early supperative changes. She also has likely viral URI. Amoxicillin 875 mgs twice daily for 10 days. Follow-up for recheck if ear symptoms not fully resolved in 2 weeks

## 2015-09-20 ENCOUNTER — Telehealth: Payer: Self-pay | Admitting: Family Medicine

## 2015-09-20 NOTE — Telephone Encounter (Signed)
Pt call to say she is still having pressure in her ear.  She is asking if she should have had relief by now or does it need to run its course

## 2015-09-20 NOTE — Telephone Encounter (Signed)
Pt is aware to continue medication.

## 2015-09-24 ENCOUNTER — Telehealth: Payer: Self-pay | Admitting: Family Medicine

## 2015-09-24 NOTE — Telephone Encounter (Signed)
Pt call to say that her left ear still feels block and would like a call back    [phone]

## 2015-09-24 NOTE — Telephone Encounter (Signed)
Pt scheduled  

## 2015-09-24 NOTE — Telephone Encounter (Signed)
Please schedule pt to be reevaluated this week.

## 2015-09-25 ENCOUNTER — Encounter: Payer: Self-pay | Admitting: Family Medicine

## 2015-09-25 ENCOUNTER — Ambulatory Visit (INDEPENDENT_AMBULATORY_CARE_PROVIDER_SITE_OTHER): Payer: BLUE CROSS/BLUE SHIELD | Admitting: Family Medicine

## 2015-09-25 VITALS — BP 118/70 | HR 80 | Temp 98.5°F | Wt 140.5 lb

## 2015-09-25 DIAGNOSIS — H6502 Acute serous otitis media, left ear: Secondary | ICD-10-CM | POA: Diagnosis not present

## 2015-09-25 NOTE — Progress Notes (Signed)
   Subjective:    Patient ID: Monica English, female    DOB: Feb 07, 1961, 55 y.o.   MRN: [id number]  HPI   Follow-up  Patient seen and treated on 09/18/15 for acute suppurative otitis media of left ear.  See prior note. Today she presents with complaint of persistent left ear pressure and pain.   Rates pain of 3/10.  Worse at night.  She describes sensation of fullness in her left ear. Denies fever, ear drainage, or hearing loss. No dizziness  She is currently taking Amoxicillin and is on day 8 of 10 day course without complications. She has taken ibuprofen for pain with some relief of pain.  Past Medical History  Diagnosis Date  . Fibroadenoma of breast     left  breast  . Cyst of breast     left  . Acid reflux   . Mitral valve prolapse   . Endometriosis   . Leiomyoma    Past Surgical History  Procedure Laterality Date  . Tonsillectomy    . Breast cyst aspiration    . Pelvic laparoscopy  [id number]    endometriosis    reports that she has never smoked. She has never used smokeless tobacco. She reports that she does not drink alcohol or use illicit drugs. family history includes Breast cancer in her maternal grandmother, mother, and other; Cancer in her maternal aunt, other, and other; Colon cancer in her other; Heart disease in her paternal grandfather and paternal grandmother; Hypertension in her mother. Allergies  Allergen Reactions  . Ciprofloxacin     hives         Review of Systems  Constitutional: Negative.   HENT: Positive for congestion, ear pain, postnasal drip and sneezing. Negative for ear discharge, sinus pressure, sore throat and tinnitus.   Eyes: Negative.   Respiratory: Negative.   Cardiovascular: Negative.   Gastrointestinal: Negative.   Endocrine: Negative.   Genitourinary: Negative.   Musculoskeletal: Negative.   Skin: Negative.   Allergic/Immunologic: Negative.   Neurological: Negative.   Hematological: Negative.     Psychiatric/Behavioral: Negative.        Objective:   Physical Exam  Constitutional: She is oriented to person, place, and time. She appears well-developed and well-nourished.  HENT:  Head: Normocephalic and atraumatic.  Right Ear: External ear normal.  Nose: Nose normal.  Mouth/Throat: Oropharynx is clear and moist.  Left ear External canal remains slightly erythematous. TM visible and grey with serous fluid visible    Eyes: Conjunctivae and EOM are normal. Pupils are equal, round, and reactive to light.  Cardiovascular: Normal rate, regular rhythm, normal heart sounds and intact distal pulses.   Pulmonary/Chest: Effort normal and breath sounds normal.  Musculoskeletal: Normal range of motion.  Neurological: She is alert and oriented to person, place, and time. She has normal reflexes.  Skin: Skin is warm and dry.  Psychiatric: She has a normal mood and affect. Her behavior is normal. Judgment and thought content normal.          Assessment & Plan:  Assessment:  Left ear pressure and pain- Suppurative otitis media appears to be clearing with antibiotic treatment.  Serous fluid remains visible in right middle ear with no evidence of perforation.  Pt aware can sometimes take several weeks to resolve.  Plan:  Take over the counter ibuprofen for pain as needed. If worsens, will consider a referral to ENT.

## 2015-09-25 NOTE — Progress Notes (Signed)
Pre visit review using our clinic review tool, if applicable. No additional management support is needed unless otherwise documented below in the visit note. 

## 2015-09-25 NOTE — Patient Instructions (Signed)
Take Ibuprofen for pain as directed, as needed. Complete the remainder of antibiotics.    Serous Otitis Media Serous otitis media is fluid in the middle ear space. This space contains the bones for hearing and air. Air in the middle ear space helps to transmit sound.  The air gets there through the eustachian tube. This tube goes from the back of the nose (nasopharynx) to the middle ear space. It keeps the pressure in the middle ear the same as the outside world. It also helps to drain fluid from the middle ear space. CAUSES  Serous otitis media occurs when the eustachian tube gets blocked. Blockage can come from:  Ear infections.  Colds and other upper respiratory infections.  Allergies.  Irritants such as cigarette smoke.  Sudden changes in air pressure (such as descending in an airplane).  Enlarged adenoids.  A mass in the nasopharynx. During colds and upper respiratory infections, the middle ear space can become temporarily filled with fluid. This can happen after an ear infection also. Once the infection clears, the fluid will generally drain out of the ear through the eustachian tube. If it does not, then serous otitis media occurs. SIGNS AND SYMPTOMS   Hearing loss.  A feeling of fullness in the ear, without pain.  Young children may not show any symptoms but may show slight behavioral changes, such as agitation, ear pulling, or crying. DIAGNOSIS  Serous otitis media is diagnosed by an ear exam. Tests may be done to check on the movement of the eardrum. Hearing exams may also be done. TREATMENT  The fluid most often goes away without treatment. If allergy is the cause, allergy treatment may be helpful. Fluid that persists for several months may require minor surgery. A small tube is placed in the eardrum to:  Drain the fluid.  Restore the air in the middle ear space. In certain situations, antibiotic medicines are used to avoid surgery. Surgery may be done to remove  enlarged adenoids (if this is the cause). HOME CARE INSTRUCTIONS   Keep children away from tobacco smoke.  Keep all follow-up visits as directed by your health care provider. SEEK MEDICAL CARE IF:   Your hearing is not better in 3 months.  Your hearing is worse.  You have ear pain.  You have drainage from the ear.  You have dizziness.  You have serous otitis media only in one ear or have any bleeding from your nose (epistaxis).  You notice a lump on your neck. MAKE SURE YOU:  Understand these instructions.   Will watch your condition.   Will get help right away if you are not doing well or get worse.    This information is not intended to replace advice given to you by your health care provider. Make sure you discuss any questions you have with your health care provider.   Document Released: 10/03/2003 Document Revised: 08/03/2014 Document Reviewed: 02/07/2013 Elsevier Interactive Patient Education [employer].

## 2015-10-15 ENCOUNTER — Telehealth: Payer: Self-pay | Admitting: Family Medicine

## 2015-10-15 NOTE — Telephone Encounter (Signed)
Pt has seen Dr Elease Hashimoto 2 x since Feb 22.  Pt states she still has a cough, sometimes productive, throat irritated from  all the coughing in the past month. Pt would like to know if she should come in again? Pt states she does not want to overlook anything because she still has these symptoms.  Please advise.

## 2015-10-15 NOTE — Telephone Encounter (Signed)
Pt has been scheduled.  °

## 2015-10-15 NOTE — Telephone Encounter (Signed)
Lets have her be reevaluated so nothing is overlooked. Thanks.

## 2015-10-16 ENCOUNTER — Encounter: Payer: Self-pay | Admitting: Family Medicine

## 2015-10-16 ENCOUNTER — Ambulatory Visit (INDEPENDENT_AMBULATORY_CARE_PROVIDER_SITE_OTHER): Payer: BLUE CROSS/BLUE SHIELD | Admitting: Family Medicine

## 2015-10-16 VITALS — BP 100/80 | HR 69 | Temp 98.1°F | Ht 64.0 in | Wt 141.8 lb

## 2015-10-16 DIAGNOSIS — R05 Cough: Secondary | ICD-10-CM

## 2015-10-16 DIAGNOSIS — R059 Cough, unspecified: Secondary | ICD-10-CM

## 2015-10-16 NOTE — Progress Notes (Signed)
   Subjective:    Patient ID: Monica English, female    DOB: [birth year], 55 y.o.   MRN: [id number]  HPI   Acute   Patient seen today with a complaint of non-productive cough time 4 weeks. She was previously treated over month ago for suppurative otitis media with accompanying URI symptoms.  Otitis media and congestion symptoms have completely resolved.  She describes a tickle in her throat which provokes her to cough followed by a burning sensation in her throat. The patient denies shortness of breath, wheezing, or fever. She is currently taking Zantac daily for GERD and Robitussin nightly to control cough. Cough is temporarily supressed with Robitussin.  Review of Systems  Constitutional: Negative.   HENT: Negative.   Eyes: Negative.   Respiratory: Positive for cough.   Cardiovascular: Negative.   Gastrointestinal: Negative.   Endocrine: Negative.   Genitourinary: Negative.   Musculoskeletal: Negative.   Skin: Negative.   Allergic/Immunologic: Negative.   Neurological: Negative.   Hematological: Negative.   Psychiatric/Behavioral: Negative.        Objective:   Physical Exam  Constitutional: She is oriented to person, place, and time. She appears well-developed and well-nourished.  HENT:  Head: Normocephalic and atraumatic.  Right Ear: External ear normal.  Left Ear: External ear normal.  Nose: Nose normal.  Mouth/Throat: Oropharynx is clear and moist.  Eyes: Conjunctivae and EOM are normal. Pupils are equal, round, and reactive to light.  Neck: Normal range of motion.  Cardiovascular: Normal rate, regular rhythm, normal heart sounds and intact distal pulses.   Pulmonary/Chest: Effort normal and breath sounds normal.  Musculoskeletal: Normal range of motion.  Neurological: She is alert and oriented to person, place, and time.  Skin: Skin is warm and dry.  Psychiatric: She has a normal mood and affect. Her behavior is normal. Judgment and thought content normal.           Assessment & Plan:  Cough-Patient presents today with a cough that has persisted for 4 weeks.  She does not have any red flags such as appetite or weight changes, dyspnea, hemoptysis, etc.. No obvious GERD or postnasal drip symptoms. Suspect post viral.  Plan: Continue Robitussin Consider chest x-ray if cough continues beyond 1-2 weeks. Patient is advised to call us if cough not fully resolved in 2 weeks.   We offered Tessalon but she prefers to try Robitussin

## 2015-10-16 NOTE — Progress Notes (Signed)
Pre visit review using our clinic review tool, if applicable. No additional management support is needed unless otherwise documented below in the visit note. 

## 2015-10-16 NOTE — Patient Instructions (Signed)

## 2016-01-29 LAB — HM MAMMOGRAPHY: HM Mammogram: ABNORMAL — AB (ref 0–4)

## 2016-01-30 ENCOUNTER — Encounter: Payer: Self-pay | Admitting: Family Medicine

## 2016-01-31 ENCOUNTER — Encounter: Payer: Self-pay | Admitting: Gynecology

## 2016-02-05 ENCOUNTER — Other Ambulatory Visit: Payer: Self-pay | Admitting: Radiology

## 2016-02-10 ENCOUNTER — Encounter: Payer: Self-pay | Admitting: Gynecology

## 2016-03-12 ENCOUNTER — Encounter: Payer: Self-pay | Admitting: Gynecology

## 2016-03-12 ENCOUNTER — Ambulatory Visit (INDEPENDENT_AMBULATORY_CARE_PROVIDER_SITE_OTHER): Payer: BLUE CROSS/BLUE SHIELD | Admitting: Gynecology

## 2016-03-12 VITALS — BP 120/76 | Ht 65.0 in | Wt 142.0 lb

## 2016-03-12 DIAGNOSIS — Z01419 Encounter for gynecological examination (general) (routine) without abnormal findings: Secondary | ICD-10-CM | POA: Diagnosis not present

## 2016-03-12 DIAGNOSIS — Z13220 Encounter for screening for lipoid disorders: Secondary | ICD-10-CM | POA: Diagnosis not present

## 2016-03-12 DIAGNOSIS — E559 Vitamin D deficiency, unspecified: Secondary | ICD-10-CM | POA: Diagnosis not present

## 2016-03-12 DIAGNOSIS — R5383 Other fatigue: Secondary | ICD-10-CM

## 2016-03-12 LAB — CBC WITH DIFFERENTIAL/PLATELET
BASOS PCT: 1 %
Basophils Absolute: 58 cells/uL (ref 0–200)
EOS PCT: 2 %
Eosinophils Absolute: 116 cells/uL (ref 15–500)
HEMATOCRIT: 42.1 % (ref 35.0–45.0)
HEMOGLOBIN: 14.1 g/dL (ref 11.7–15.5)
LYMPHS ABS: 3074 {cells}/uL (ref 850–3900)
Lymphocytes Relative: 53 %
MCH: 29.9 pg (ref 27.0–33.0)
MCHC: 33.5 g/dL (ref 32.0–36.0)
MCV: 89.4 fL (ref 80.0–100.0)
MONO ABS: 522 {cells}/uL (ref 200–950)
MPV: 10.6 fL (ref 7.5–12.5)
Monocytes Relative: 9 %
NEUTROS ABS: 2030 {cells}/uL (ref 1500–7800)
NEUTROS PCT: 35 %
PLATELETS: 162 10*3/uL (ref 140–400)
RBC: 4.71 MIL/uL (ref 3.80–5.10)
RDW: 14 % (ref 11.0–15.0)
WBC: 5.8 10*3/uL (ref 3.8–10.8)

## 2016-03-12 LAB — LIPID PANEL
Cholesterol: 182 mg/dL (ref 125–200)
HDL: 61 mg/dL (ref >=46)
LDL CALC: 102 mg/dL (ref <130)
TRIGLYCERIDES: 95 mg/dL (ref <150)
Total CHOL/HDL Ratio: 3 Ratio (ref <=5.0)
VLDL: 19 mg/dL (ref <30)

## 2016-03-12 LAB — COMPREHENSIVE METABOLIC PANEL
ALBUMIN: 4.4 g/dL (ref 3.6–5.1)
ALT: 30 U/L — ABNORMAL HIGH (ref 6–29)
AST: 25 U/L (ref 10–35)
Alkaline Phosphatase: 50 U/L (ref 33–130)
BUN: 19 mg/dL (ref 7–25)
CHLORIDE: 103 mmol/L (ref 98–110)
CO2: 25 mmol/L (ref 20–31)
CREATININE: 0.8 mg/dL (ref 0.50–1.05)
Calcium: 9.2 mg/dL (ref 8.6–10.4)
Glucose, Bld: 85 mg/dL (ref 65–99)
Potassium: 3.9 mmol/L (ref 3.5–5.3)
SODIUM: 138 mmol/L (ref 135–146)
Total Bilirubin: 0.6 mg/dL (ref 0.2–1.2)
Total Protein: 7 g/dL (ref 6.1–8.1)

## 2016-03-12 LAB — TSH: TSH: 1.02 mIU/L

## 2016-03-12 NOTE — Progress Notes (Signed)
    Monica English 1960/08/15 [id number]        55 y.o.  G0P0  for annual exam.    Past medical history,surgical history, problem list, medications, allergies, family history and social history were all reviewed and documented as reviewed in the EPIC chart.  ROS:  Performed with pertinent positives and negatives included in the history, assessment and plan.   Additional significant findings :  Fatigue as discussed below   Exam: Caryn Bee assistant Vitals:   03/12/16 1056  BP: 120/76  Weight: 142 lb (64.4 kg)  Height: 5\' 5"  (1.651 m)   Body mass index is 23.63 kg/m.  General appearance:  Normal affect, orientation and appearance. Skin: Grossly normal HEENT: Without gross lesions.  No cervical or supraclavicular adenopathy. Thyroid normal.  Lungs:  Clear without wheezing, rales or rhonchi Cardiac: RR, without RMG Abdominal:  Soft, nontender, without masses, guarding, rebound, organomegaly or hernia Breasts:  Examined lying and sitting without masses, retractions, discharge or axillary adenopathy. Pelvic:  Ext/BUS/Vagina normal  Cervix normal  Uterus generous in size midline mobile nontender  Adnexa without masses or tenderness    Anus and perineum normal   Rectovaginal normal sphincter tone without palpated masses or tenderness.    Assessment/Plan:  55 y.o. [name] for annual exam.   1. Fatigue. Patient notes of the past year fatigue particularly in the afternoon. Also struggling with some weight changes. No skin hair changes no constipation or diarrhea. No palpitations. Does admit to not exercising regularly. We'll check baseline CBC, CMP and TSH. Assuming normal she'll attempt to exercise more on a regular basis. If continues to be a significant issue them will follow up with primary physician. 2. Leiomyoma. Uterus generous in size. Had been 10 weeks size by Dr. Valeta Harms exam previously and it feels to have involuted somewhat consistent with menopause. She is  asymptomatic. Continue to monitor with annual exams. 3. Pap smear 2016. No Pap smear done today. No history of significant abnormal Pap smears. 4. Mammography 01/2016. Continue with annual mammography when due. SBE monthly reviewed. 5. Colonoscopy never. I again strongly recommended patient schedule a baseline colonoscopy. Names and numbers provided. 6. Vitamin D deficiency. Vitamin D 16 last year. Is taking extra vitamin D. Does admit though to missing some doses. Check vitamin D level today. 7. Health maintenance. Patient requests routine blood work. Baseline CBC, CMP, lipid profile, TSH, vitamin D, urinalysis ordered.  10 minutes of my time in excess of her breast and pelvic exam was spent in direct face to face counseling and coordination of care in regards to her problems of fatigue, leiomyoma, vitamin D deficiency.   Anastasio Auerbach MD, 11:18 AM 03/12/2016

## 2016-03-12 NOTE — Patient Instructions (Signed)
Schedule your colonoscopy with either:  [facility]   Address: [address], [place], [place] [address]  [phone]    or  [facility]  Address: [address], [place], [place] [address]  [phone]      You may obtain a copy of any labs that were done today by logging onto MyChart as outlined in the instructions provided with your AVS (after visit summary). The office will not call with normal lab results but certainly if there are any significant abnormalities then we will contact you.   Health Maintenance Adopting a healthy lifestyle and getting preventive care can go a long way to promote health and wellness. Talk with your health care provider about what schedule of regular examinations is right for you. This is a good chance for you to check in with your provider about disease prevention and staying healthy. In between checkups, there are plenty of things you can do on your own. Experts have done a lot of research about which lifestyle changes and preventive measures are most likely to keep you healthy. Ask your health care provider for more information. WEIGHT AND DIET  Eat a healthy diet  Be sure to include plenty of vegetables, fruits, low-fat dairy products, and lean protein.  Do not eat a lot of foods high in solid fats, added sugars, or salt.  Get regular exercise. This is one of the most important things you can do for your health.  Most adults should exercise for at least 150 minutes each week. The exercise should increase your heart rate and make you sweat (moderate-intensity exercise).  Most adults should also do strengthening exercises at least twice a week. This is in addition to the moderate-intensity exercise.  Maintain a healthy weight  Body mass index (BMI) is a measurement that can be used to identify possible weight problems. It estimates body fat based on height and weight. Your health care provider can help determine  your BMI and help you achieve or maintain a healthy weight.  For females 22 years of age and older:   A BMI below 18.5 is considered underweight.  A BMI of 18.5 to 24.9 is normal.  A BMI of 25 to 29.9 is considered overweight.  A BMI of 30 and above is considered obese.  Watch levels of cholesterol and blood lipids  You should start having your blood tested for lipids and cholesterol at 54 years of age, then have this test every 5 years.  You may need to have your cholesterol levels checked more often if:  Your lipid or cholesterol levels are high.  You are older than 54 years of age.  You are at high risk for heart disease.  CANCER SCREENING   Lung Cancer  Lung cancer screening is recommended for adults 19-51 years old who are at high risk for lung cancer because of a history of smoking.  A yearly low-dose CT scan of the lungs is recommended for people who:  Currently smoke.  Have quit within the past 15 years.  Have at least a 30-pack-year history of smoking. A pack year is smoking an average of one pack of cigarettes a day for 1 year.  Yearly screening should continue until it has been 15 years since you quit.  Yearly screening should stop if you develop a health problem that would prevent you from having lung cancer treatment.  Breast Cancer  Practice breast self-awareness. This means understanding how your breasts normally appear and  feel.  It also means doing regular breast self-exams. Let your health care provider know about any changes, no matter how small.  If you are in your 20s or 30s, you should have a clinical breast exam (CBE) by a health care provider every 1-3 years as part of a regular health exam.  If you are 35 or older, have a CBE every year. Also consider having a breast X-ray (mammogram) every year.  If you have a family history of breast cancer, talk to your health care provider about genetic screening.  If you are at high risk for breast  cancer, talk to your health care provider about having an MRI and a mammogram every year.  Breast cancer gene (BRCA) assessment is recommended for women who have family members with BRCA-related cancers. BRCA-related cancers include:  Breast.  Ovarian.  Tubal.  Peritoneal cancers.  Results of the assessment will determine the need for genetic counseling and BRCA1 and BRCA2 testing. Cervical Cancer Routine pelvic examinations to screen for cervical cancer are no longer recommended for nonpregnant women who are considered low risk for cancer of the pelvic organs (ovaries, uterus, and vagina) and who do not have symptoms. A pelvic examination may be necessary if you have symptoms including those associated with pelvic infections. Ask your health care provider if a screening pelvic exam is right for you.   The Pap test is the screening test for cervical cancer for women who are considered at risk.  If you had a hysterectomy for a problem that was not cancer or a condition that could lead to cancer, then you no longer need Pap tests.  If you are older than 65 years, and you have had normal Pap tests for the past 10 years, you no longer need to have Pap tests.  If you have had past treatment for cervical cancer or a condition that could lead to cancer, you need Pap tests and screening for cancer for at least 20 years after your treatment.  If you no longer get a Pap test, assess your risk factors if they change (such as having a new sexual partner). This can affect whether you should start being screened again.  Some women have medical problems that increase their chance of getting cervical cancer. If this is the case for you, your health care provider may recommend more frequent screening and Pap tests.  The human papillomavirus (HPV) test is another test that may be used for cervical cancer screening. The HPV test looks for the virus that can cause cell changes in the cervix. The cells  collected during the Pap test can be tested for HPV.  The HPV test can be used to screen women 72 years of age and older. Getting tested for HPV can extend the interval between normal Pap tests from three to five years.  An HPV test also should be used to screen women of any age who have unclear Pap test results.  After 54 years of age, women should have HPV testing as often as Pap tests.  Colorectal Cancer  This type of cancer can be detected and often prevented.  Routine colorectal cancer screening usually begins at 53 years of age and continues through 54 years of age.  Your health care provider may recommend screening at an earlier age if you have risk factors for colon cancer.  Your health care provider may also recommend using home test kits to check for hidden blood in the stool.  A small camera  at the end of a tube can be used to examine your colon directly (sigmoidoscopy or colonoscopy). This is done to check for the earliest forms of colorectal cancer.  Routine screening usually begins at age 40.  Direct examination of the colon should be repeated every 5-10 years through 54 years of age. However, you may need to be screened more often if early forms of precancerous polyps or small growths are found. Skin Cancer  Check your skin from head to toe regularly.  Tell your health care provider about any new moles or changes in moles, especially if there is a change in a mole's shape or color.  Also tell your health care provider if you have a mole that is larger than the size of a pencil eraser.  Always use sunscreen. Apply sunscreen liberally and repeatedly throughout the day.  Protect yourself by wearing long sleeves, pants, a wide-brimmed hat, and sunglasses whenever you are outside. HEART DISEASE, DIABETES, AND HIGH BLOOD PRESSURE   Have your blood pressure checked at least every 1-2 years. High blood pressure causes heart disease and increases the risk of stroke.  If  you are between 74 years and 72 years old, ask your health care provider if you should take aspirin to prevent strokes.  Have regular diabetes screenings. This involves taking a blood sample to check your fasting blood sugar level.  If you are at a normal weight and have a low risk for diabetes, have this test once every three years after 54 years of age.  If you are overweight and have a high risk for diabetes, consider being tested at a younger age or more often. PREVENTING INFECTION  Hepatitis B  If you have a higher risk for hepatitis B, you should be screened for this virus. You are considered at high risk for hepatitis B if:  You were born in a country where hepatitis B is common. Ask your health care provider which countries are considered high risk.  Your parents were born in a high-risk country, and you have not been immunized against hepatitis B (hepatitis B vaccine).  You have HIV or AIDS.  You use needles to inject street drugs.  You live with someone who has hepatitis B.  You have had sex with someone who has hepatitis B.  You get hemodialysis treatment.  You take certain medicines for conditions, including cancer, organ transplantation, and autoimmune conditions. Hepatitis C  Blood testing is recommended for:  Everyone born from [birth year] through 1964.  Anyone with known risk factors for hepatitis C. Sexually transmitted infections (STIs)  You should be screened for sexually transmitted infections (STIs) including gonorrhea and chlamydia if:  You are sexually active and are younger than 54 years of age.  You are older than 54 years of age and your health care provider tells you that you are at risk for this type of infection.  Your sexual activity has changed since you were last screened and you are at an increased risk for chlamydia or gonorrhea. Ask your health care provider if you are at risk.  If you do not have HIV, but are at risk, it may be recommended that  you take a prescription medicine daily to prevent HIV infection. This is called pre-exposure prophylaxis (PrEP). You are considered at risk if:  You are sexually active and do not regularly use condoms or know the HIV status of your partner(s).  You take drugs by injection.  You are sexually active with a partner  who has HIV. Talk with your health care provider about whether you are at high risk of being infected with HIV. If you choose to begin PrEP, you should first be tested for HIV. You should then be tested every 3 months for as long as you are taking PrEP.  PREGNANCY   If you are premenopausal and you may become pregnant, ask your health care provider about preconception counseling.  If you may become pregnant, take 400 to 800 micrograms (mcg) of folic acid every day.  If you want to prevent pregnancy, talk to your health care provider about birth control (contraception). OSTEOPOROSIS AND MENOPAUSE   Osteoporosis is a disease in which the bones lose minerals and strength with aging. This can result in serious bone fractures. Your risk for osteoporosis can be identified using a bone density scan.  If you are 8 years of age or older, or if you are at risk for osteoporosis and fractures, ask your health care provider if you should be screened.  Ask your health care provider whether you should take a calcium or vitamin D supplement to lower your risk for osteoporosis.  Menopause may have certain physical symptoms and risks.  Hormone replacement therapy may reduce some of these symptoms and risks. Talk to your health care provider about whether hormone replacement therapy is right for you.  HOME CARE INSTRUCTIONS   Schedule regular health, dental, and eye exams.  Stay current with your immunizations.   Do not use any tobacco products including cigarettes, chewing tobacco, or electronic cigarettes.  If you are pregnant, do not drink alcohol.  If you are breastfeeding, limit how  much and how often you drink alcohol.  Limit alcohol intake to no more than 1 drink per day for nonpregnant women. One drink equals 12 ounces of beer, 5 ounces of wine, or 1 ounces of hard liquor.  Do not use street drugs.  Do not share needles.  Ask your health care provider for help if you need support or information about quitting drugs.  Tell your health care provider if you often feel depressed.  Tell your health care provider if you have ever been abused or do not feel safe at home. Document Released: 01/26/2011 Document Revised: 11/27/2013 Document Reviewed: 06/14/2013 [facility] Patient Information 2015 [place], [place]. This information is not intended to replace advice given to you by your health care provider. Make sure you discuss any questions you have with your health care provider.

## 2016-03-13 LAB — URINALYSIS W MICROSCOPIC + REFLEX CULTURE
BILIRUBIN URINE: NEGATIVE
Bacteria, UA: NONE SEEN [HPF]
CRYSTALS: NONE SEEN [HPF]
Casts: NONE SEEN [LPF]
Glucose, UA: NEGATIVE
Hgb urine dipstick: NEGATIVE
Ketones, ur: NEGATIVE
NITRITE: NEGATIVE
PH: 6 (ref 5.0–8.0)
Protein, ur: NEGATIVE
RBC / HPF: NONE SEEN RBC/HPF (ref <=2)
SPECIFIC GRAVITY, URINE: 1.011 (ref 1.001–1.035)
Yeast: NONE SEEN [HPF]

## 2016-03-13 LAB — VITAMIN D 25 HYDROXY (VIT D DEFICIENCY, FRACTURES): VIT D 25 HYDROXY: 27 ng/mL — AB (ref 30–100)

## 2016-03-14 LAB — URINE CULTURE: Organism ID, Bacteria: NO GROWTH

## 2016-07-15 ENCOUNTER — Ambulatory Visit (INDEPENDENT_AMBULATORY_CARE_PROVIDER_SITE_OTHER): Payer: BLUE CROSS/BLUE SHIELD | Admitting: Family Medicine

## 2016-07-15 DIAGNOSIS — Z23 Encounter for immunization: Secondary | ICD-10-CM

## 2016-08-04 ENCOUNTER — Telehealth: Payer: Self-pay | Admitting: Family Medicine

## 2016-08-04 NOTE — Telephone Encounter (Signed)
Patient Name: [name]  DOB: [birth year]    Initial Comment Caller states that she has a bruise on her shin Dec. 7th when grocery cart hit her skeen. She still has a tender knot and wants to know if that is normal.    Nurse Assessment  Nurse: Leilani Merl, RN, Heather Date/Time (Eastern Time): 08/04/2016 2:41:39 PM  Confirm and document reason for call. If symptomatic, describe symptoms. ---Caller states that she has a bruise on her shin Dec. 7th when grocery cart hit her skin. She still has a tender knot and wants to know if that is normal. The knot has gone down a lot, but it is still there and a little tender  Does the patient have any new or worsening symptoms? ---Yes  Will a triage be completed? ---Yes  Related visit to physician within the last 2 weeks? ---No  Does the PT have any chronic conditions? (i.e. diabetes, asthma, etc.) ---Yes  List chronic conditions. ---See MR  Is the patient pregnant or possibly pregnant? (Ask all females between the ages of 72-55) ---No  Is this a behavioral health or substance abuse call? ---No     Guidelines    Guideline Title Affirmed Question Affirmed Notes  Leg Injury [1] After 2 weeks AND [2] still painful or swollen    Final Disposition User   See PCP When Office is Open (within 3 days) [place], RN, Water quality scientist    Comments  Appt made for tomorrow with Dr. Elease Hashimoto at 7:45 am/   Referrals  REFERRED TO PCP OFFICE   Disagree/Comply: Comply

## 2016-08-04 NOTE — Telephone Encounter (Signed)
Noted  

## 2016-08-05 ENCOUNTER — Ambulatory Visit (INDEPENDENT_AMBULATORY_CARE_PROVIDER_SITE_OTHER): Payer: BLUE CROSS/BLUE SHIELD | Admitting: Family Medicine

## 2016-08-05 VITALS — BP 110/80 | HR 121 | Temp 98.2°F | Ht 65.0 in | Wt 146.8 lb

## 2016-08-05 DIAGNOSIS — S8011XA Contusion of right lower leg, initial encounter: Secondary | ICD-10-CM

## 2016-08-05 NOTE — Patient Instructions (Signed)
Hematoma A hematoma is a collection of blood under the skin, in an organ, in a body space, in a joint space, or in other tissue. The blood can clot to form a lump that you can see and feel. The lump is often firm and may sometimes become sore and tender. Most hematomas get better in a few days to weeks. However, some hematomas may be serious and require medical care. Hematomas can range in size from very small to very large. What are the causes? A hematoma can be caused by a blunt or penetrating injury. It can also be caused by spontaneous leakage from a blood vessel under the skin. Spontaneous leakage from a blood vessel is more likely to occur in older people, especially those taking blood thinners. Sometimes, a hematoma can develop after certain medical procedures. What are the signs or symptoms?  A firm lump on the body.  Possible pain and tenderness in the area.  Bruising.Blue, dark blue, purple-red, or yellowish skin may appear at the site of the hematoma if the hematoma is close to the surface of the skin. For hematomas in deeper tissues or body spaces, the signs and symptoms may be subtle. For example, an intra-abdominal hematoma may cause abdominal pain, weakness, fainting, and shortness of breath. An intracranial hematoma may cause a headache or symptoms such as weakness, trouble speaking, or a change in consciousness. How is this diagnosed? A hematoma can usually be diagnosed based on your medical history and a physical exam. Imaging tests may be needed if your health care provider suspects a hematoma in deeper tissues or body spaces, such as the abdomen, head, or chest. These tests may include ultrasonography or a CT scan. How is this treated? Hematomas usually go away on their own over time. Rarely does the blood need to be drained out of the body. Large hematomas or those that may affect vital organs will sometimes need surgical drainage or monitoring. Follow these instructions at  home:  Apply ice to the injured area:  Put ice in a plastic bag.  Place a towel between your skin and the bag.  Leave the ice on for 20 minutes, 2-3 times a day for the first 1 to 2 days.  After the first 2 days, switch to using warm compresses on the hematoma.  Elevate the injured area to help decrease pain and swelling. Wrapping the area with an elastic bandage may also be helpful. Compression helps to reduce swelling and promotes shrinking of the hematoma. Make sure the bandage is not wrapped too tight.  If your hematoma is on a lower extremity and is painful, crutches may be helpful for a couple days.  Only take over-the-counter or prescription medicines as directed by your health care provider. Get help right away if:  You have increasing pain, or your pain is not controlled with medicine.  You have a fever.  You have worsening swelling or discoloration.  Your skin over the hematoma breaks or starts bleeding.  Your hematoma is in your chest or abdomen and you have weakness, shortness of breath, or a change in consciousness.  Your hematoma is on your scalp (caused by a fall or injury) and you have a worsening headache or a change in alertness or consciousness. This information is not intended to replace advice given to you by your health care provider. Make sure you discuss any questions you have with your health care provider. Document Released: 02/25/2004 Document Revised: 12/19/2015 Document Reviewed: 12/21/2012 Elsevier Interactive Patient   Education  2017 [employer] Inc[employer]  

## 2016-08-05 NOTE — Progress Notes (Signed)
Pre visit review using our clinic review tool, if applicable. No additional management support is needed unless otherwise documented below in the visit note. 

## 2016-08-05 NOTE — Progress Notes (Signed)
Subjective:     Patient ID: Monica English, female   DOB: 09-13-1960, 56 y.o.   MRN: [id number]  HPI  Patient is seen with mild swelling of her right proximal leg. She states around either December 7th or 8 she was at the grocery store and was pushing along her grocery cart. There was an electric box in the floor that was sticking up and her grocery cart ran into that and stopped the cart suddenly and her leg jammed against the cart. She had some immediate swelling of her right proximally but no bruising. She was able to ambulate without any difficulty. She's had some persistent swelling in that region since then but slowly decreasing in size. She's not had any calf pain. No erythema.  Past Medical History:  Diagnosis Date  . Acid reflux   . Cyst of breast    left  . Endometriosis   . Fibroadenoma of breast    left  breast  . Leiomyoma   . Mitral valve prolapse    Past Surgical History:  Procedure Laterality Date  . BREAST CYST ASPIRATION    . PELVIC LAPAROSCOPY  [id number]   endometriosis  . TONSILLECTOMY      reports that she has never smoked. She has never used smokeless tobacco. She reports that she does not drink alcohol or use drugs. family history includes Breast cancer in her maternal grandmother, mother, and other; Cancer in her maternal aunt, other, and other; Colon cancer in her other; Heart disease in her paternal grandfather and paternal grandmother; Hypertension in her mother. Allergies  Allergen Reactions  . Ciprofloxacin     hives    Review of Systems  Respiratory: Negative for shortness of breath.   Cardiovascular: Negative for chest pain.  Musculoskeletal: Negative for gait problem.       Objective:   Physical Exam  Constitutional: She appears well-developed and well-nourished.  Cardiovascular: Normal rate and regular rhythm.   Pulmonary/Chest: Effort normal and breath sounds normal. No respiratory distress. She has no wheezes. She has no rales.   Musculoskeletal:  Right leg reveals small subcutaneous hematoma (about 1 cm)over the proximal right leg just below the knee. There is no overlying erythema. Minimally tender. No warmth. No ecchymosis. No calf tenderness. No difficulties with plantarflexion or dorsiflexion       Assessment:     Small hematoma right proximal leg following traumatic injury. Minimally symptomatic and improving    Plan:     -Reassurance -Recommend topical heat and explained this will resolve over several months if not weeks  Eulas Post MD [facility]

## 2017-01-28 ENCOUNTER — Ambulatory Visit: Payer: Self-pay | Admitting: Family Medicine

## 2017-01-29 ENCOUNTER — Ambulatory Visit: Payer: Self-pay | Admitting: Family Medicine

## 2017-04-15 ENCOUNTER — Encounter: Payer: Self-pay | Admitting: Family Medicine

## 2017-05-04 ENCOUNTER — Ambulatory Visit: Payer: Self-pay

## 2017-07-08 ENCOUNTER — Ambulatory Visit (INDEPENDENT_AMBULATORY_CARE_PROVIDER_SITE_OTHER): Payer: Self-pay | Admitting: Family Medicine

## 2017-07-08 ENCOUNTER — Encounter: Payer: Self-pay | Admitting: Family Medicine

## 2017-07-08 VITALS — BP 110/80 | HR 80 | Temp 98.2°F | Wt 150.9 lb

## 2017-07-08 DIAGNOSIS — J0100 Acute maxillary sinusitis, unspecified: Secondary | ICD-10-CM

## 2017-07-08 MED ORDER — AMOXICILLIN-POT CLAVULANATE 875-125 MG PO TABS: 1.0000 | ORAL_TABLET | Freq: Two times a day (BID) | ORAL | 0 refills | Status: DC

## 2017-07-08 NOTE — Progress Notes (Signed)
Subjective:     Patient ID: Monica English, female   DOB: [birth year], 56 y.o.   MRN: [id number]  HPI Patient seen for acute visit for sinus congestive symptoms. Onset a few days ago of dull headache, chills, possible low-grade fever, left maxillary facial pain. She's had some thick yellow discharge. She's taken some Tylenol without much relief. Also tried saline nasal irrigation and Sudafed. She's had some increased malaise. Only minimal cough.  Past Medical History:  Diagnosis Date  . Acid reflux   . Cyst of breast    left  . Endometriosis   . Fibroadenoma of breast    left  breast  . Leiomyoma   . Mitral valve prolapse    Past Surgical History:  Procedure Laterality Date  . BREAST CYST ASPIRATION    . PELVIC LAPAROSCOPY  [id number]   endometriosis  . TONSILLECTOMY      reports that  has never smoked. she has never used smokeless tobacco. She reports that she does not drink alcohol or use drugs. family history includes Breast cancer in her maternal grandmother, mother, and other; Cancer in her maternal aunt, other, and other; Colon cancer in her other; Heart disease in her paternal grandfather and paternal grandmother; Hypertension in her mother. Allergies  Allergen Reactions  . Ciprofloxacin     hives     Review of Systems  Constitutional: Positive for chills and fatigue.  HENT: Positive for congestion, sinus pressure, sinus pain and sore throat.   Neurological: Positive for headaches.       Objective:   Physical Exam  Constitutional: She appears well-developed and well-nourished.  HENT:  Right Ear: External ear normal.  Left Ear: External ear normal.  Mild erythema posterior pharynx. No exudate  Neck: Neck supple.  Cardiovascular: Normal rate and regular rhythm.  Pulmonary/Chest: Effort normal and breath sounds normal. No respiratory distress. She has no wheezes. She has no rales.  Lymphadenopathy:    She has no cervical adenopathy.       Assessment:      Acute sinusitis. We explained given duration this may all be viral but she does have one-sided symptoms    Plan:     -We recommended observation and continue with conservative measures including hydration, daytime Sudafed use, saline nasal irrigation, Mucinex -Printed prescription for Augmentin 875 mg twice daily to start if she has any progressive or persistent symptoms  Eulas Post MD [facility] at [facility]

## 2017-07-08 NOTE — Patient Instructions (Signed)

## 2019-04-25 ENCOUNTER — Encounter: Payer: Self-pay | Admitting: Gynecology

## 2019-10-26 ENCOUNTER — Ambulatory Visit: Payer: Self-pay | Attending: Internal Medicine

## 2019-10-26 DIAGNOSIS — Z23 Encounter for immunization: Secondary | ICD-10-CM

## 2019-10-26 NOTE — Progress Notes (Signed)
   Covid-19 Vaccination Clinic  Name:  ALEENE DURAKOVIC    MRN: [id number] DOB: [birth year]  10/26/2019  Ms. Buchannon was observed post Covid-19 immunization for 15 minutes without incident. She was provided with Vaccine Information Sheet and instruction to access the V-Safe system.   Ms. Dejong was instructed to call 911 with any severe reactions post vaccine: Marland Kitchen Difficulty breathing  . Swelling of face and throat  . A fast heartbeat  . A bad rash all over body  . Dizziness and weakness   Immunizations Administered    Name Date Dose VIS Date Route   Pfizer COVID-19 Vaccine 10/26/2019  4:33 PM 0.3 mL 07/07/2019 Intramuscular   Manufacturer: [place]   Lot: [id number]   [place]: [id number]

## 2019-11-20 ENCOUNTER — Ambulatory Visit: Payer: Self-pay | Attending: Internal Medicine

## 2019-11-20 DIAGNOSIS — Z23 Encounter for immunization: Secondary | ICD-10-CM

## 2019-11-20 NOTE — Progress Notes (Signed)
   Covid-19 Vaccination Clinic  Name:  Monica English    MRN: [id number] DOB: 1960-11-06  11/20/2019  [name] was observed post Covid-19 immunization for 15 minutes without incident. She was provided with Vaccine Information Sheet and instruction to access the V-Safe system.   [name] was instructed to call 911 with any severe reactions post vaccine: Marland Kitchen Difficulty breathing  . Swelling of face and throat  . A fast heartbeat  . A bad rash all over body  . Dizziness and weakness   Immunizations Administered    Name Date Dose VIS Date Route   Pfizer COVID-19 Vaccine 11/20/2019  4:19 PM 0.3 mL 09/20/2018 Intramuscular   Manufacturer: [place]   Lot: [id number]   [place]: [id number]

## 2020-06-17 ENCOUNTER — Other Ambulatory Visit: Payer: Self-pay

## 2020-06-18 ENCOUNTER — Encounter: Payer: Self-pay | Admitting: Family Medicine

## 2020-06-18 ENCOUNTER — Ambulatory Visit (INDEPENDENT_AMBULATORY_CARE_PROVIDER_SITE_OTHER): Payer: Self-pay | Admitting: Family Medicine

## 2020-06-18 VITALS — BP 120/80 | HR 71 | Temp 98.1°F | Wt 140.0 lb

## 2020-06-18 DIAGNOSIS — M542 Cervicalgia: Secondary | ICD-10-CM

## 2020-06-18 NOTE — Progress Notes (Signed)
Established Patient Office Visit  Subjective:  Patient ID: Monica English, female    DOB: November 23, 1960  Age: 59 y.o. MRN: [id number]  CC:  Chief Complaint  Patient presents with  . Neck Pain    HPI Monica English presents for onset last Friday of right-sided neck pain.  This is in the mid neck region.  She denies any specific injury.  She has not noted any lymphadenopathy.  Pain is noted with certain movements and also with things like coughing.  She has not had any sore throat or pain with swallowing.  Appetite is stable.  No weight loss.  No fever.  No significant coughing.  She has taken ibuprofen which helps.  Denies any posterior neck pain.  No radiculitis symptoms.  No skin rashes.  No visible swelling.  No chest pain.  Past Medical History:  Diagnosis Date  . Acid reflux   . Cyst of breast    left  . Endometriosis   . Fibroadenoma of breast    left  breast  . Leiomyoma   . Mitral valve prolapse     Past Surgical History:  Procedure Laterality Date  . BREAST CYST ASPIRATION    . PELVIC LAPAROSCOPY  [id number]   endometriosis  . TONSILLECTOMY      Family History  Problem Relation Age of Onset  . Hypertension Mother   . Breast cancer Mother        Age 34  . Breast cancer Maternal Grandmother        Age 31  . Heart disease Paternal Grandmother   . Heart disease Paternal Grandfather   . Colon cancer Other   . Cancer Other        colon  . Breast cancer Other        Age 76's  . Cancer Other        breast  . Cancer Maternal Aunt        Liver    Social History   Socioeconomic History  . Marital status: Married    Spouse name: Not on file  . Number of children: Not on file  . Years of education: Not on file  . Highest education level: Not on file  Occupational History  . Not on file  Tobacco Use  . Smoking status: Never Smoker  . Smokeless tobacco: Never Used  Substance and Sexual Activity  . Alcohol use: No    Alcohol/week: 0.0 standard drinks  .  Drug use: No  . Sexual activity: Yes    Birth control/protection: Other-see comments    Comment: vasectomy--1st intercourse 59 yo-fewer than 5 partners  Other Topics Concern  . Not on file  Social History Narrative  . Not on file   Social Determinants of Health   Financial Resource Strain:   . Difficulty of Paying Living Expenses: Not on file  Food Insecurity:   . Worried About Charity fundraiser in the Last Year: Not on file  . Ran Out of Food in the Last Year: Not on file  Transportation Needs:   . Lack of Transportation (Medical): Not on file  . Lack of Transportation (Non-Medical): Not on file  Physical Activity:   . Days of Exercise per Week: Not on file  . Minutes of Exercise per Session: Not on file  Stress:   . Feeling of Stress : Not on file  Social Connections:   . Frequency of Communication with Friends and Family: Not on file  .  Frequency of Social Gatherings with Friends and Family: Not on file  . Attends Religious Services: Not on file  . Active Member of Clubs or Organizations: Not on file  . Attends Archivist Meetings: Not on file  . Marital Status: Not on file  Intimate Partner Violence:   . Fear of Current or Ex-Partner: Not on file  . Emotionally Abused: Not on file  . Physically Abused: Not on file  . Sexually Abused: Not on file    Outpatient Medications Prior to Visit  Medication Sig Dispense Refill  . Cholecalciferol (VITAMIN D PO) Takes 2,000 per day.    . famotidine (PEPCID) 10 MG tablet Take 10 mg by mouth 2 (two) times daily.    Marland Kitchen ibuprofen (ADVIL,MOTRIN) 200 MG tablet Take 200 mg by mouth every 6 (six) hours as needed.      . Multiple Vitamin (MULTIVITAMIN) tablet Take 1 tablet by mouth daily.    Marland Kitchen amoxicillin-clavulanate (AUGMENTIN) 875-125 MG tablet Take 1 tablet by mouth 2 (two) times daily. 20 tablet 0  . ranitidine (ZANTAC) 150 MG tablet Take 75 mg by mouth daily.      No facility-administered medications prior to visit.     Allergies  Allergen Reactions  . Ciprofloxacin     hives    ROS Review of Systems  Constitutional: Negative for chills and fever.  HENT: Negative for sore throat, trouble swallowing and voice change.   Respiratory: Negative for cough and shortness of breath.   Cardiovascular: Negative for chest pain.  Hematological: Negative for adenopathy.      Objective:    Physical Exam Vitals reviewed.  Constitutional:      Appearance: Normal appearance.  Neck:     Comments: No neck masses palpated.  She has an area of localized tenderness basically along the mid right sternocleidomastoid muscle but no palpable enlargement or masses.  No warmth.  No overlying erythema. Cardiovascular:     Rate and Rhythm: Normal rate and regular rhythm.  Pulmonary:     Effort: Pulmonary effort is normal.     Breath sounds: Normal breath sounds.  Musculoskeletal:     Cervical back: Neck supple.  Lymphadenopathy:     Cervical: No cervical adenopathy.  Skin:    Findings: No rash.  Neurological:     Mental Status: She is alert.     BP 120/80 (BP Location: Left Arm, Patient Position: Sitting, Cuff Size: Normal)   Pulse 71   Temp 98.1 F (36.7 C) (Oral)   Wt 140 lb (63.5 kg)   LMP 01/28/2012   SpO2 99%   BMI 23.30 kg/m  Wt Readings from Last 3 Encounters:  06/18/20 140 lb (63.5 kg)  07/08/17 150 lb 14.4 oz (68.4 kg)  08/05/16 146 lb 12.8 oz (66.6 kg)     Health Maintenance Due  Topic Date Due  . Hepatitis C Screening  Never done  . HIV Screening  Never done    There are no preventive care reminders to display for this patient.  Lab Results  Component Value Date   TSH 1.02 03/12/2016   Lab Results  Component Value Date   WBC 5.8 03/12/2016   HGB 14.1 03/12/2016   HCT 42.1 03/12/2016   MCV 89.4 03/12/2016   PLT 162 03/12/2016   Lab Results  Component Value Date   NA 138 03/12/2016   K 3.9 03/12/2016   CO2 25 03/12/2016   GLUCOSE 85 03/12/2016   BUN 19 03/12/2016  CREATININE 0.80 03/12/2016   BILITOT 0.6 03/12/2016   ALKPHOS 50 03/12/2016   AST 25 03/12/2016   ALT 30 (H) 03/12/2016   PROT 7.0 03/12/2016   ALBUMIN 4.4 03/12/2016   CALCIUM 9.2 03/12/2016   GFR 80.55 04/06/2012   Lab Results  Component Value Date   CHOL 182 03/12/2016   Lab Results  Component Value Date   HDL 61 03/12/2016   Lab Results  Component Value Date   LDLCALC 102 03/12/2016   Lab Results  Component Value Date   TRIG 95 03/12/2016   Lab Results  Component Value Date   CHOLHDL 3.0 03/12/2016   No results found for: HGBA1C    Assessment & Plan:   Patient relates less than 1 week history of right-sided neck pain.  This appears to be musculoskeletal or soft tissue related.  No abnormalities on exam other than some localized tenderness but no palpable edema or adenopathy  -We recommend observation at this time.  Be in touch by next week if not improving. -Follow-up immediately for any increased swelling or other concerns -She will continue with some over-the-counter ibuprofen in the meantime  No orders of the defined types were placed in this encounter.   Follow-up: No follow-ups on file.    Carolann Littler, MD

## 2020-06-18 NOTE — Patient Instructions (Signed)
Let me know if neck symptoms not improved by next week.   Watch for any swelling, fever, lymph node enlargement or other concerns.

## 2020-12-17 ENCOUNTER — Ambulatory Visit: Payer: Self-pay | Admitting: Family Medicine

## 2020-12-18 ENCOUNTER — Other Ambulatory Visit: Payer: Self-pay

## 2020-12-18 ENCOUNTER — Ambulatory Visit (INDEPENDENT_AMBULATORY_CARE_PROVIDER_SITE_OTHER): Payer: Self-pay | Admitting: Family Medicine

## 2020-12-18 ENCOUNTER — Encounter: Payer: Self-pay | Admitting: Family Medicine

## 2020-12-18 VITALS — BP 130/72 | HR 73 | Temp 97.9°F | Wt 141.3 lb

## 2020-12-18 DIAGNOSIS — S8011XA Contusion of right lower leg, initial encounter: Secondary | ICD-10-CM

## 2020-12-18 NOTE — Progress Notes (Signed)
Established Patient Office Visit  Subjective:  Patient ID: Monica English, female    DOB: [birth year]  Age: 60 y.o. MRN: [id number]  CC:  Chief Complaint  Patient presents with  . Leg Pain    Dog gate hit the back of the leg in febuary 2022, still bruised, small knott, very tender    HPI Monica English presents for right posterior calf swelling and mild pain.  She states back in February a dog gate fell against her leg.  She had some immediate swelling and bruising.  No difficulty ambulating.  She is noticed some firmness to the tissue and minimal soreness but ambulating with no pain.  She has not noted any weakness with dorsiflexion or plantarflexion.  No distal Achilles tenderness.  Has not noted any edema in her foot, ankle, or lower leg.  No proximal calf pain.   Past Medical History:  Diagnosis Date  . Acid reflux   . Cyst of breast    left  . Endometriosis   . Fibroadenoma of breast    left  breast  . Leiomyoma   . Mitral valve prolapse     Past Surgical History:  Procedure Laterality Date  . BREAST CYST ASPIRATION    . PELVIC LAPAROSCOPY  [id number]   endometriosis  . TONSILLECTOMY      Family History  Problem Relation Age of Onset  . Hypertension Mother   . Breast cancer Mother        Age 1  . Breast cancer Maternal Grandmother        Age 21  . Heart disease Paternal Grandmother   . Heart disease Paternal Grandfather   . Colon cancer Other   . Cancer Other        colon  . Breast cancer Other        Age 6's  . Cancer Other        breast  . Cancer Maternal Aunt        Liver    Social History   Socioeconomic History  . Marital status: Married    Spouse name: Not on file  . Number of children: Not on file  . Years of education: Not on file  . Highest education level: Not on file  Occupational History  . Not on file  Tobacco Use  . Smoking status: Never Smoker  . Smokeless tobacco: Never Used  Substance and Sexual Activity  . Alcohol use:  No    Alcohol/week: 0.0 standard drinks  . Drug use: No  . Sexual activity: Yes    Birth control/protection: Other-see comments    Comment: vasectomy--1st intercourse 60 yo-fewer than 5 partners  Other Topics Concern  . Not on file  Social History Narrative  . Not on file   Social Determinants of Health   Financial Resource Strain: Not on file  Food Insecurity: Not on file  Transportation Needs: Not on file  Physical Activity: Not on file  Stress: Not on file  Social Connections: Not on file  Intimate Partner Violence: Not on file    Outpatient Medications Prior to Visit  Medication Sig Dispense Refill  . Cholecalciferol (VITAMIN D PO) Takes 2,000 per day.    . famotidine (PEPCID) 10 MG tablet Take 10 mg by mouth 2 (two) times daily.    Marland Kitchen ibuprofen (ADVIL,MOTRIN) 200 MG tablet Take 200 mg by mouth every 6 (six) hours as needed.    . Multiple Vitamin (MULTIVITAMIN) tablet Take 1 tablet by  mouth daily.     No facility-administered medications prior to visit.    Allergies  Allergen Reactions  . Ciprofloxacin     hives    ROS Review of Systems  Constitutional: Negative for chills and fever.  Respiratory: Negative for shortness of breath.   Cardiovascular: Negative for chest pain.      Objective:    Physical Exam Vitals reviewed.  Constitutional:      Appearance: Normal appearance.  Cardiovascular:     Rate and Rhythm: Normal rate and regular rhythm.  Musculoskeletal:     Comments: Right leg examined.  She has some minimal soft tissue induration distal calf region near the proximal aspect of the Achilles tendon.  No palpable nodules.  She has full range of motion with plantarflexion and dorsiflexion.  Nontender to palpation.  No warmth.  No significant erythema.  Neurological:     Mental Status: She is alert.     BP 130/72 (BP Location: Left Arm, Patient Position: Sitting, Cuff Size: Normal)   Pulse 73   Temp 97.9 F (36.6 C) (Oral)   Wt 141 lb 4.8 oz (64.1  kg)   LMP 01/28/2012   SpO2 98%   BMI 23.51 kg/m  Wt Readings from Last 3 Encounters:  12/18/20 141 lb 4.8 oz (64.1 kg)  06/18/20 140 lb (63.5 kg)  07/08/17 150 lb 14.4 oz (68.4 kg)     Health Maintenance Due  Topic Date Due  . HIV Screening  Never done  . Hepatitis C Screening  Never done  . COLONOSCOPY (Pts 45-64yrs Insurance coverage will need to be confirmed)  Never done  . MAMMOGRAM  01/30/2017  . PAP SMEAR-Modifier  07/30/2017  . COVID-19 Vaccine (3 - Booster for Pfizer series) 04/21/2020    There are no preventive care reminders to display for this patient.  Lab Results  Component Value Date   TSH 1.02 03/12/2016   Lab Results  Component Value Date   WBC 5.8 03/12/2016   HGB 14.1 03/12/2016   HCT 42.1 03/12/2016   MCV 89.4 03/12/2016   PLT 162 03/12/2016   Lab Results  Component Value Date   NA 138 03/12/2016   K 3.9 03/12/2016   CO2 25 03/12/2016   GLUCOSE 85 03/12/2016   BUN 19 03/12/2016   CREATININE 0.80 03/12/2016   BILITOT 0.6 03/12/2016   ALKPHOS 50 03/12/2016   AST 25 03/12/2016   ALT 30 (H) 03/12/2016   PROT 7.0 03/12/2016   ALBUMIN 4.4 03/12/2016   CALCIUM 9.2 03/12/2016   GFR 80.55 04/06/2012   Lab Results  Component Value Date   CHOL 182 03/12/2016   Lab Results  Component Value Date   HDL 61 03/12/2016   Lab Results  Component Value Date   LDLCALC 102 03/12/2016   Lab Results  Component Value Date   TRIG 95 03/12/2016   Lab Results  Component Value Date   CHOLHDL 3.0 03/12/2016   No results found for: HGBA1C    Assessment & Plan:   Mild soft tissue induration right posterior calf distally following direct trauma several months ago.  No difficulties in ambulation.  No evidence for Achilles tendon injury.  No clinical suspicion for DVT.  -Reassurance and observation.  We suggest that she try some moist heat such as warm sitz bath as and good stretches and follow-up for any increased swelling or other concerns  No  orders of the defined types were placed in this encounter.   Follow-up: No follow-ups  on file.    Travus Oren, MD 

## 2020-12-18 NOTE — Patient Instructions (Signed)
Consider warm water soaks at least a few times per week.  I think this will soften up with time.

## 2020-12-25 ENCOUNTER — Encounter: Payer: Self-pay | Admitting: Family Medicine

## 2021-06-27 ENCOUNTER — Telehealth: Payer: Self-pay

## 2021-06-27 NOTE — Telephone Encounter (Signed)
Caller states, may have a vaginal cyst. Doing warm compressed and taking Ibuprofen. is this ok or will she need antibiotics. States is painful, red, is not open or draining , states is the size of a dime. Denies fever.  06/26/2021 11:12:20 AM SEE PCP WITHIN 3 DAYS Yes Conner, RN, Pamela  Referrals REFERRED TO PCP OFFICE  06/27/21 1142: Pt states she was told yesterday to do a sitz bath. Pt states pain has decreased & has noted serosanguinous drainage from site. Pt has also been covering site with Neosporin guaze. Pt advised to continue sitz baths (can be done 3x/day) & observe for increased pain, fever, discolored drainage or foul odor. If noted, pt will need to be seen. Pt states she does not currently have GYN. Pt verb understanding & will continue home care management at this time.

## 2021-08-07 ENCOUNTER — Encounter: Payer: Self-pay | Admitting: Nurse Practitioner

## 2021-08-07 ENCOUNTER — Other Ambulatory Visit: Payer: Self-pay

## 2021-08-07 ENCOUNTER — Ambulatory Visit (INDEPENDENT_AMBULATORY_CARE_PROVIDER_SITE_OTHER): Payer: Self-pay | Admitting: Nurse Practitioner

## 2021-08-07 VITALS — BP 124/78

## 2021-08-07 DIAGNOSIS — N764 Abscess of vulva: Secondary | ICD-10-CM

## 2021-08-07 NOTE — Progress Notes (Signed)
° °  Acute Office Visit  Subjective:    Patient ID: Monica English, female    DOB: 1961/04/11, 61 y.o.   MRN: [id number]   HPI 61 y.o. presents today for boil on perineum. It first appeared yesterday morning and quickly grew in size. It was tender and fluctuant. She did sitz baths and it ruptured this morning. Bloody drainage. She had similar incident in November, also self-limiting at that time and resolved completely. She thinks it may be in same area.    Review of Systems  Constitutional: Negative.   Skin:  Positive for wound (Ruptured boil-like area on perineum).      Objective:    Physical Exam Constitutional:      Appearance: Normal appearance.  Genitourinary:     BP 124/78    LMP 01/28/2012  Wt Readings from Last 3 Encounters:  12/18/20 141 lb 4.8 oz (64.1 kg)  06/18/20 140 lb (63.5 kg)  07/08/17 150 lb 14.4 oz (68.4 kg)        Assessment & Plan:   Problem List Items Addressed This Visit   None Visit Diagnoses     Boil of vulva    -  Primary      Plan: Resolved at visit today. No evidence of infection. Apply triple-antibiotic ointment for 1-2 days and keep area clean and dry. If recurrence occurs, we discussed that there may be a deeper pocket that needs to be drained to avoid further recurrences. She is agreeable to plan.      Tamela Gammon DNP, 8:44 AM 08/07/2021

## 2021-09-01 ENCOUNTER — Telehealth: Payer: Self-pay | Admitting: Family Medicine

## 2021-09-01 ENCOUNTER — Encounter: Payer: Self-pay | Admitting: Family Medicine

## 2021-09-01 ENCOUNTER — Other Ambulatory Visit: Payer: Self-pay

## 2021-09-01 ENCOUNTER — Telehealth (INDEPENDENT_AMBULATORY_CARE_PROVIDER_SITE_OTHER): Payer: Self-pay | Admitting: Family Medicine

## 2021-09-01 DIAGNOSIS — U071 COVID-19: Secondary | ICD-10-CM

## 2021-09-01 NOTE — Assessment & Plan Note (Signed)
Mild  Day 6 and improved Too late for antiviral  Pt has been immunized   Disc symptom control  Will continue robitussin DM and sudafed  Tylenol prn  Afrin one more day maximum May want to add nasal saline spray ER precautions discussed Update if not starting to improve in a week or if worsening

## 2021-09-01 NOTE — Patient Instructions (Signed)
Drink fluids and rest  robitussin DM is good for cough and congestion  Nasal saline for congestion as needed  Tylenol for fever or pain or headache  Please alert [place] if symptoms worsen (if severe or short of breath please go to the ER)   Update if not starting to improve in a week   Continue to isolate until symptoms are better

## 2021-09-01 NOTE — Progress Notes (Signed)
Virtual Visit via Video Note  I connected with Monica English on 09/01/21 at  4:00 PM EST by a video enabled telemedicine application and verified that I am speaking with the correct person using two identifiers.  Location: Patient: home Provider: office    I discussed the limitations of evaluation and management by telemedicine and the availability of in person appointments. The patient expressed understanding and agreed to proceed.  Parties involved in encounter  Patient: Monica English  Provider:  Loura Pardon MD   History of Present Illness: 61 yo pt of Dr Elease Hashimoto presents with covid 19  Started symptoms mid last week  congestion  Very very tired on Tuesday  Thursday pnd  Then headache and chills  Fever th through Sunday night  Nasal congestion bad since Saturday   Today starting to improve, can breathe through her nose better now   Cough sounds a little croupy  Not severe  Not productive No wheezing  Uses pulse ox -98-99% now   She cares for her grand daughter (was sick 2 wk ago but not more recently)   Otc:  Tylenol for fever or pain  Robitussin DM  Sudafed  Afrin one night (tiny bit)  Lots of fluids  Has some saline spray   Patient Active Problem List   Diagnosis Date Noted   COVID-19 09/01/2021   Palpitations 04/25/2012   Abnormal ECG 04/25/2012   Endometriosis    Fibroid    Acid reflux    Mitral valve prolapse    Past Medical History:  Diagnosis Date   Acid reflux    Cyst of breast    left   Endometriosis    Fibroadenoma of breast    left  breast   Leiomyoma    Mitral valve prolapse    Past Surgical History:  Procedure Laterality Date   BREAST CYST ASPIRATION     PELVIC LAPAROSCOPY  [id number]   endometriosis   TONSILLECTOMY     Social History   Tobacco Use   Smoking status: Never   Smokeless tobacco: Never  Substance Use Topics   Alcohol use: No    Alcohol/week: 0.0 standard drinks   Drug use: No   Family History  Problem  Relation Age of Onset   Hypertension Mother    Breast cancer Mother        Age 31   Breast cancer Maternal Grandmother        Age 55   Heart disease Paternal Grandmother    Heart disease Paternal Grandfather    Colon cancer Other    Cancer Other        colon   Breast cancer Other        Age 21's   Cancer Other        breast   Cancer Maternal Aunt        Liver   Allergies  Allergen Reactions   Ciprofloxacin     hives   Current Outpatient Medications on File Prior to Visit  Medication Sig Dispense Refill   Cholecalciferol (VITAMIN D PO) 1,000 Units.     famotidine (PEPCID) 10 MG tablet Take 10 mg by mouth 2 (two) times daily.     Multiple Vitamin (MULTIVITAMIN) tablet Take 1 tablet by mouth daily.     No current facility-administered medications on file prior to visit.   Review of Systems  Constitutional:  Positive for malaise/fatigue. Negative for chills and fever.  HENT:  Positive for congestion. Negative for ear pain,  sinus pain and sore throat.   Eyes:  Negative for blurred vision, discharge and redness.  Respiratory:  Positive for cough. Negative for sputum production, shortness of breath, wheezing and stridor.   Cardiovascular:  Negative for chest pain, palpitations and leg swelling.  Gastrointestinal:  Negative for abdominal pain, diarrhea, nausea and vomiting.  Musculoskeletal:  Negative for myalgias.  Skin:  Negative for rash.  Neurological:  Positive for headaches. Negative for dizziness.   Observations/Objective: Patient appears well, in no distress Weight is baseline  No facial swelling or asymmetry Voice is mildly hoarse  Sniffles occasionally  No obvious tremor or mobility impairment Moving neck and UEs normally Able to hear the call well  No cough or shortness of breath during interview  Talkative and mentally sharp with no cognitive changes No skin changes on face or neck , no rash or pallor Affect is normal    Assessment and Plan: Problem List  Items Addressed This Visit       Other   COVID-19    Mild  Day 6 and improved Too late for antiviral  Pt has been immunized   Disc symptom control  Will continue robitussin DM and sudafed  Tylenol prn  Afrin one more day maximum May want to add nasal saline spray ER precautions discussed Update if not starting to improve in a week or if worsening          Follow Up Instructions: Drink fluids and rest  robitussin DM is good for cough and congestion  Nasal saline for congestion as needed  Tylenol for fever or pain or headache  Please alert [place] if symptoms worsen (if severe or short of breath please go to the ER)   Update if not starting to improve in a week   Continue to isolate until symptoms are better    I discussed the assessment and treatment plan with the patient. The patient was provided an opportunity to ask questions and all were answered. The patient agreed with the plan and demonstrated an understanding of the instructions.   The patient was advised to call back or seek an in-person evaluation if the symptoms worsen or if the condition fails to improve as anticipated.     Loura Pardon, MD

## 2021-09-01 NOTE — Telephone Encounter (Signed)
FYI pt is calling to let md know she tested positive for covid  on 08-29-2021 and at this time does not want to make an appt

## 2021-09-01 NOTE — Telephone Encounter (Signed)
Noted  

## 2021-09-24 ENCOUNTER — Encounter: Payer: Self-pay | Admitting: Family Medicine

## 2021-09-24 ENCOUNTER — Ambulatory Visit (INDEPENDENT_AMBULATORY_CARE_PROVIDER_SITE_OTHER): Payer: Self-pay | Admitting: Family Medicine

## 2021-09-24 VITALS — BP 118/80 | HR 77 | Temp 98.6°F | Resp 16 | Ht 65.0 in | Wt 146.6 lb

## 2021-09-24 DIAGNOSIS — M25562 Pain in left knee: Secondary | ICD-10-CM

## 2021-09-24 DIAGNOSIS — L659 Nonscarring hair loss, unspecified: Secondary | ICD-10-CM

## 2021-09-24 DIAGNOSIS — L853 Xerosis cutis: Secondary | ICD-10-CM

## 2021-09-24 DIAGNOSIS — R635 Abnormal weight gain: Secondary | ICD-10-CM

## 2021-09-24 NOTE — Progress Notes (Signed)
? ?Established Patient Office Visit ? ?Subjective:  ?Patient ID: Monica English, female    DOB: April 11, 1961  Age: 61 y.o. MRN: [id number] ? ?CC:  ?Chief Complaint  ?Patient presents with  ? pain in left knee  ?  Pt states that this problem started a while ago ?  ? ? ?HPI ?Monica English presents for the following items ? ?She relates pain around her left knee region which she first noticed around December..  No specific injury.  She helps care for her 66-month-old granddaughter and frequently gets down her knees playing with her.  She had no pain with ambulation.  She has 1 pain which is around the lateral aspect of the knee near the distal attachment iliotibial band but another pain which is involving the distal lateral femoral condyle region.  She feels like there may be some slight swelling in that region but no warmth.  No ecchymosis.  No weakness.  No sciatica type symptoms.  She has not noted any knee effusion.  Her concern was the persistence of pain now for several months. ? ?Other issue is she is noted some general thinning of the hair recently.  She has dry skin and has had some weight gain.  She realizes some of the weight gain may be related to infrequent exercise.  She is specifically concerned about hypothyroidism.  No recent labs. ? ?She had COVID about a month ago.  Still recovering taste and smell from that.  Occasional intermittent sore throat since then but overall improved. ? ?Wt Readings from Last 3 Encounters:  ?09/24/21 146 lb 9.6 oz (66.5 kg)  ?12/18/20 141 lb 4.8 oz (64.1 kg)  ?06/18/20 140 lb (63.5 kg)  ? ? ? ?Past Medical History:  ?Diagnosis Date  ? Acid reflux   ? Cyst of breast   ? left  ? Endometriosis   ? 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 Leiomyoma   ? Mitral valve prolapse   ? ? ?Past Surgical History:  ?Procedure Laterality Date  ? BREAST CYST ASPIRATION    ? PELVIC LAPAROSCOPY  [id number]  ? endometriosis  ? TONSILLECTOMY    ? ? ?Family History  ?Problem Relation Age of  Onset  ? Hypertension Mother   ? Breast cancer Mother   ?     Age 66  ? Breast cancer Maternal Grandmother   ?     Age 36  ? Heart disease Paternal Grandmother   ? Heart disease Paternal Grandfather   ? Colon cancer Other   ? Cancer Other   ?     colon  ? Breast cancer Other   ?     Age 33's  ? Cancer Other   ?     breast  ? Cancer Maternal Aunt   ?     Liver  ? ? ?Social History  ? ?Socioeconomic History  ? Marital status: Married  ?  Spouse name: Not on file  ? Number of children: Not on file  ? Years of education: Not on file  ? Highest education level: Not on file  ?Occupational History  ? Not on file  ?Tobacco Use  ? Smoking status: Never  ? Smokeless tobacco: Never  ?Substance and Sexual Activity  ? Alcohol use: No  ?  Alcohol/week: 0.0 standard drinks  ? Drug use: No  ? Sexual activity: Yes  ?  Birth control/protection: Other-see comments  ?  Comment: vasectomy--1st intercourse 61 yo-fewer than 5  partners  ?Other Topics Concern  ? Not on file  ?Social History Narrative  ? Not on file  ? ?Social Determinants of Health  ? ?Financial Resource Strain: Not on file  ?Food Insecurity: Not on file  ?Transportation Needs: Not on file  ?Physical Activity: Not on file  ?Stress: Not on file  ?Social Connections: Not on file  ?Intimate Partner Violence: Not on file  ? ? ?Outpatient Medications Prior to Visit  ?Medication Sig Dispense Refill  ? Cholecalciferol (VITAMIN D PO) 1,000 Units.    ? famotidine (PEPCID) 10 MG tablet Take 10 mg by mouth 2 (two) times daily.    ? Multiple Vitamin (MULTIVITAMIN) tablet Take 1 tablet by mouth daily.    ? ?No facility-administered medications prior to visit.  ? ? ?Allergies  ?Allergen Reactions  ? Ciprofloxacin   ?  hives  ? ? ?ROS ?Review of Systems  ?Constitutional:  Positive for fatigue. Negative for appetite change.  ?Respiratory:  Negative for cough and shortness of breath.   ?Cardiovascular:  Negative for chest pain.  ?Neurological:  Negative for weakness.  ?Hematological:   Negative for adenopathy. Does not bruise/bleed easily.  ? ?  ?Objective:  ?  ?Physical Exam ?Vitals reviewed.  ?Constitutional:   ?   Appearance: Normal appearance.  ?Cardiovascular:  ?   Rate and Rhythm: Normal rate and regular rhythm.  ?Pulmonary:  ?   Effort: Pulmonary effort is normal.  ?   Breath sounds: Normal breath sounds.  ?Musculoskeletal:  ?   Comments: Knee reveals full range of motion.  No effusion.  No warmth.  No erythema.  She has some mild tenderness over the left lateral femoral condyle.  No masses palpated.  Minimal tenderness over the distal attachment iliotibial band to the fibular head region.  ?Neurological:  ?   Mental Status: She is alert.  ? ? ?BP 118/80   Pulse 77   Temp 98.6 ?F (37 ?C)   Resp 16   Ht 5\' 5"  (1.651 m)   Wt 146 lb 9.6 oz (66.5 kg)   LMP 01/28/2012   SpO2 98%   BMI 24.40 kg/m?  ?Wt Readings from Last 3 Encounters:  ?09/24/21 146 lb 9.6 oz (66.5 kg)  ?12/18/20 141 lb 4.8 oz (64.1 kg)  ?06/18/20 140 lb (63.5 kg)  ? ? ? ?Health Maintenance Due  ?Topic Date Due  ? HIV Screening  Never done  ? Hepatitis C Screening  Never done  ? TETANUS/TDAP  Never done  ? COLONOSCOPY (Pts 45-34yrs Insurance coverage will need to be confirmed)  Never done  ? Zoster Vaccines- Shingrix (1 of 2) Never done  ? MAMMOGRAM  01/30/2017  ? PAP SMEAR-Modifier  07/30/2017  ? COVID-19 Vaccine (3 - Booster for Pfizer series) 01/15/2020  ? INFLUENZA VACCINE  02/24/2021  ? ? ?There are no preventive care reminders to display for this patient. ? ?Lab Results  ?Component Value Date  ? TSH 1.02 03/12/2016  ? ?Lab Results  ?Component Value Date  ? WBC 5.8 03/12/2016  ? HGB 14.1 03/12/2016  ? HCT 42.1 03/12/2016  ? MCV 89.4 03/12/2016  ? PLT 162 03/12/2016  ? ?Lab Results  ?Component Value Date  ? NA 138 03/12/2016  ? K 3.9 03/12/2016  ? CO2 25 03/12/2016  ? GLUCOSE 85 03/12/2016  ? BUN 19 03/12/2016  ? CREATININE 0.80 03/12/2016  ? BILITOT 0.6 03/12/2016  ? ALKPHOS 50 03/12/2016  ? AST 25 03/12/2016  ? ALT  30 (H) 03/12/2016  ?  PROT 7.0 03/12/2016  ? ALBUMIN 4.4 03/12/2016  ? CALCIUM 9.2 03/12/2016  ? GFR 80.55 04/06/2012  ? ?Lab Results  ?Component Value Date  ? CHOL 182 03/12/2016  ? ?Lab Results  ?Component Value Date  ? HDL 61 03/12/2016  ? ?Lab Results  ?Component Value Date  ? [place] 102 03/12/2016  ? ?Lab Results  ?Component Value Date  ? TRIG 95 03/12/2016  ? ?Lab Results  ?Component Value Date  ? CHOLHDL 3.0 03/12/2016  ? ?No results found for: HGBA1C ? ?  ?Assessment & Plan:  ? ?#1 left knee pain.  Somewhat poorly localized.  Pain tends to be lateral and mostly left lateral femoral condyle region.  No injury.  No visible swelling.  No masses palpated.  Given duration of symptoms will obtain x-rays left knee to further assess.  Order placed. ? ?#2 patient relates constellation of symptoms including general thinning of hair, dry skin, weight gain.  She is concerned about thyroid disorder.  Future lab order placed for TSH and free T4. ? ? ?No orders of the defined types were placed in this encounter. ? ? ?Follow-up: No follow-ups on file.  ? ? ?Carolann Littler, MD ?

## 2021-10-20 ENCOUNTER — Encounter: Payer: Self-pay | Admitting: Family Medicine

## 2021-10-20 ENCOUNTER — Telehealth (INDEPENDENT_AMBULATORY_CARE_PROVIDER_SITE_OTHER): Payer: Self-pay | Admitting: Family Medicine

## 2021-10-20 VITALS — Ht 65.0 in | Wt 146.6 lb

## 2021-10-20 DIAGNOSIS — J0110 Acute frontal sinusitis, unspecified: Secondary | ICD-10-CM

## 2021-10-20 MED ORDER — AMOXICILLIN-POT CLAVULANATE 875-125 MG PO TABS: 1.0000 | ORAL_TABLET | Freq: Two times a day (BID) | ORAL | 0 refills | Status: DC

## 2021-10-20 NOTE — Progress Notes (Signed)
Patient ID: Monica English, female   DOB: 1960-08-04, 61 y.o.   MRN: [id number] ? ?This visit type was conducted due to national recommendations for restrictions regarding the COVID-19 pandemic in an effort to limit this patient's exposure and mitigate transmission in our community.  ? ?Virtual Visit via Video Note ? ?I connected with Monica English on 10/20/21 at  2:30 PM EDT by a video enabled telemedicine application and verified that I am speaking with the correct person using two identifiers. ? Location patient: home ?Location provider:work or home office ?Persons participating in the virtual visit: patient, provider ? ?I discussed the limitations of evaluation and management by telemedicine and the availability of in person appointments. The patient expressed understanding and agreed to proceed. ? ? ?HPI: ? ?Monica English is concerned about possible "sinusitis".  She states around March 9th she developed a cold.  She has had some persistent congestion since then.  She seemed to worsen around Saturday.  She felt like her sinuses were tightening up.  She had some increased cough.  Chills.  Low-grade fever.  COVID-negative.  Mild body aches.  Greenish nasal discharge.  Intermittent sore throat.  Frontal sinus pressure.  No significant relief with Tylenol and Sudafed. ? ?ROS: See pertinent positives and negatives per HPI. ? ?Past Medical History:  ?Diagnosis Date  ? Acid reflux   ? Cyst of breast   ? left  ? Endometriosis   ? 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 Leiomyoma   ? Mitral valve prolapse   ? ? ?Past Surgical History:  ?Procedure Laterality Date  ? BREAST CYST ASPIRATION    ? PELVIC LAPAROSCOPY  [id number]  ? endometriosis  ? TONSILLECTOMY    ? ? ?Family History  ?Problem Relation Age of Onset  ? Hypertension Mother   ? Breast cancer Mother   ?     Age 11  ? Breast cancer Maternal Grandmother   ?     Age 38  ? Heart disease Paternal Grandmother   ? Heart disease Paternal Grandfather   ? Colon cancer Other   ?  Cancer Other   ?     colon  ? Breast cancer Other   ?     Age 78's  ? Cancer Other   ?     breast  ? Cancer Maternal Aunt   ?     Liver  ? ? ?SOCIAL HX: Non-smoker ? ? ?Current Outpatient Medications:  ?  amoxicillin-clavulanate (AUGMENTIN) 875-125 MG tablet, Take 1 tablet by mouth 2 (two) times daily., Disp: 20 tablet, Rfl: 0 ?  Cholecalciferol (VITAMIN D PO), 1,000 Units., Disp: , Rfl:  ?  famotidine (PEPCID) 10 MG tablet, Take 10 mg by mouth 2 (two) times daily., Disp: , Rfl:  ?  Multiple Vitamin (MULTIVITAMIN) tablet, Take 1 tablet by mouth daily., Disp: , Rfl:  ? ?EXAM: ? ?VITALS per patient if applicable: ? ?GENERAL: alert, oriented, appears well and in no acute distress ? ?HEENT: atraumatic, conjunttiva clear, no obvious abnormalities on inspection of external nose and ears ? ?NECK: normal movements of the head and neck ? ?LUNGS: on inspection no signs of respiratory distress, breathing rate appears normal, no obvious gross SOB, gasping or wheezing ? ?CV: no obvious cyanosis ? ?MS: moves all visible extremities without noticeable abnormality ? ?PSYCH/NEURO: pleasant and cooperative, no obvious depression or anxiety, speech and thought processing grossly intact ? ?ASSESSMENT AND PLAN: ? ?Discussed the following assessment and plan: ? ? ?Probable  acute frontal sinusitis. ? ?-Start Augmentin 875 mg twice daily for 10 days ?-Continue plenty of fluids ?-Follow-up for any persistent or worsening symptoms ? ?  ?I discussed the assessment and treatment plan with the patient. The patient was provided an opportunity to ask questions and all were answered. The patient agreed with the plan and demonstrated an understanding of the instructions. ?  ?The patient was advised to call back or seek an in-person evaluation if the symptoms worsen or if the condition fails to improve as anticipated. ? ? ? ? ?Monica Littler, MD  ? ?

## 2021-11-07 ENCOUNTER — Telehealth: Payer: Self-pay | Admitting: Family Medicine

## 2021-11-07 DIAGNOSIS — L659 Nonscarring hair loss, unspecified: Secondary | ICD-10-CM

## 2021-11-07 NOTE — Telephone Encounter (Signed)
Pt called in asking if she could just drop in for a blood draw to check her thyroid function. Checked orders and there was no lab order/appointment, review of notes revealed the lab was discussed at a prior visit. Pt requesting labwork for thyroid (c/o thinning hair) ?

## 2021-11-10 NOTE — Telephone Encounter (Signed)
Labs placed and pt aware and will call back for lab appt  ?

## 2021-11-11 ENCOUNTER — Other Ambulatory Visit (INDEPENDENT_AMBULATORY_CARE_PROVIDER_SITE_OTHER): Payer: Self-pay

## 2021-11-11 DIAGNOSIS — L659 Nonscarring hair loss, unspecified: Secondary | ICD-10-CM

## 2021-11-12 LAB — TSH: TSH: 0.8 u[IU]/mL (ref 0.35–5.50)

## 2022-05-11 ENCOUNTER — Ambulatory Visit (INDEPENDENT_AMBULATORY_CARE_PROVIDER_SITE_OTHER): Payer: Self-pay | Admitting: Family Medicine

## 2022-05-11 ENCOUNTER — Encounter: Payer: Self-pay | Admitting: Family Medicine

## 2022-05-11 VITALS — BP 124/88 | HR 68 | Temp 98.1°F | Wt 147.4 lb

## 2022-05-11 DIAGNOSIS — J019 Acute sinusitis, unspecified: Secondary | ICD-10-CM

## 2022-05-11 MED ORDER — AMOXICILLIN-POT CLAVULANATE 875-125 MG PO TABS: 1.0000 | ORAL_TABLET | Freq: Two times a day (BID) | ORAL | 0 refills | Status: DC

## 2022-05-11 NOTE — Progress Notes (Signed)
   Subjective:    Patient ID: Monica English, female    DOB: Dec 18, 1960, 61 y.o.   MRN: [id number]  HPI Here for 10 days of stuffy head, PND, a dry cough, and blowing green mucus from the nose. No fever. She tested negative for the Covid virus at home this morning.    Review of Systems  Constitutional: Negative.   HENT:  Positive for congestion, postnasal drip and sinus pressure. Negative for ear pain and sore throat.   Eyes: Negative.   Respiratory:  Positive for cough. Negative for shortness of breath and wheezing.   Gastrointestinal: Negative.        Objective:   Physical Exam Constitutional:      Appearance: Normal appearance.  HENT:     Right Ear: Tympanic membrane, ear canal and external ear normal.     Left Ear: Tympanic membrane, ear canal and external ear normal.     Nose: Nose normal.     Mouth/Throat:     Pharynx: Oropharynx is clear.  Eyes:     Conjunctiva/sclera: Conjunctivae normal.  Pulmonary:     Effort: Pulmonary effort is normal.     Breath sounds: Normal breath sounds.  Lymphadenopathy:     Cervical: No cervical adenopathy.  Neurological:     Mental Status: She is alert.           Assessment & Plan:  Sinusitis. Treat with 10 days of Augmentin.  Alysia Penna, MD

## 2022-06-02 ENCOUNTER — Telehealth: Payer: Self-pay

## 2022-06-02 NOTE — Telephone Encounter (Signed)
Caller requesting an appt. Sent from the office. Caller states she has a cold and cough and in the night she started feeling a pull in her chest/ribs when she takes a breath. No fever. --caller states she has a cold, cough, chest tightness/ pulling with come breaths  06/02/2022 8:56:03 AM SEE PCP WITHIN 3 DAYS [place], RN, Estill Bamberg  Comments User: Rozelle Logan, RN Date/Time Eilene Ghazi Time): 06/02/2022 8:50:56 AM was treated oct 16th with sinus infection but got completely better.  User: Rozelle Logan, RN Date/Time Eilene Ghazi Time): 06/02/2022 8:52:11 AM pulse ox 96-8%  Pt has appt with PCP on 06/03/22

## 2022-06-03 ENCOUNTER — Ambulatory Visit (INDEPENDENT_AMBULATORY_CARE_PROVIDER_SITE_OTHER): Payer: Self-pay | Admitting: Family Medicine

## 2022-06-03 ENCOUNTER — Encounter: Payer: Self-pay | Admitting: Family Medicine

## 2022-06-03 ENCOUNTER — Ambulatory Visit: Payer: Self-pay | Admitting: Family Medicine

## 2022-06-03 VITALS — BP 126/84 | HR 88 | Temp 97.9°F | Ht 65.0 in | Wt 148.4 lb

## 2022-06-03 DIAGNOSIS — R062 Wheezing: Secondary | ICD-10-CM

## 2022-06-03 DIAGNOSIS — R059 Cough, unspecified: Secondary | ICD-10-CM

## 2022-06-03 LAB — POCT INFLUENZA A/B
Influenza A, POC: NEGATIVE
Influenza B, POC: NEGATIVE

## 2022-06-03 LAB — POC COVID19 BINAXNOW: SARS Coronavirus 2 Ag: NEGATIVE

## 2022-06-03 MED ORDER — PREDNISONE 20 MG PO TABS: ORAL_TABLET | ORAL | 0 refills | Status: DC

## 2022-06-03 MED ORDER — ALBUTEROL SULFATE HFA 108 (90 BASE) MCG/ACT IN AERS: 2.0000 | INHALATION_SPRAY | Freq: Four times a day (QID) | RESPIRATORY_TRACT | 0 refills | Status: DC | PRN

## 2022-06-03 NOTE — Progress Notes (Signed)
Established Patient Office Visit  Subjective   Patient ID: Monica English, female    DOB: [birth year]  Age: 61 y.o. MRN: [id number]  Chief Complaint  Patient presents with   Sore Throat    Patient complains of sore throat, x3 days    Nasal Congestion    Patient complains of nasal congestion, x3 days    Cough    Patient complains of cough, x3 days, Tried Robitussin and sudafed, Productive cough     HPI   Beuna is seen with about 3-day history of cough.  She feels like she is wheezing some at night.  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  And also relates some recent nasal congestion and postnasal drainage.  No dyspnea at rest.  No sore throat symptoms.  Past Medical History:  Diagnosis Date   Acid reflux    Cyst of breast    left   Endometriosis    Fibroadenoma of breast    left  breast   Leiomyoma    Mitral valve prolapse    Past Surgical History:  Procedure Laterality Date   BREAST CYST ASPIRATION     PELVIC LAPAROSCOPY  [id number]   endometriosis   TONSILLECTOMY      reports that she has never smoked. She has never used smokeless tobacco. She reports that she does not drink alcohol and does not use drugs. family history includes Breast cancer in her maternal grandmother, mother, and another family member; Cancer in her maternal aunt and other family members; Colon cancer in an other family member; Heart disease in her paternal grandfather and paternal grandmother; Hypertension in her mother. Allergies  Allergen Reactions   Ciprofloxacin     hives    Review of Systems  Constitutional:  Negative for chills and fever.  HENT:  Positive for congestion.   Respiratory:  Positive for cough and wheezing. Negative for hemoptysis.   Cardiovascular:  Negative for chest pain.      Objective:     BP 126/84 (BP Location: Left Arm, Patient Position: Sitting, Cuff Size: Normal)   Pulse 88   Temp 97.9 F  (36.6 C) (Oral)   Ht '5\' 5"'$  (1.651 m)   Wt 148 lb 6.4 oz (67.3 kg)   LMP 01/28/2012   SpO2 99%   BMI 24.70 kg/m    Physical Exam Vitals reviewed.  Constitutional:      General: She is not in acute distress.    Appearance: She is well-developed. She is not ill-appearing.  HENT:     Right Ear: Tympanic membrane normal.     Left Ear: Tympanic membrane normal.     Mouth/Throat:     Mouth: Mucous membranes are moist.     Pharynx: Oropharynx is clear. No oropharyngeal exudate or posterior oropharyngeal erythema.  Cardiovascular:     Rate and Rhythm: Normal rate and regular rhythm.  Pulmonary:     Effort: Pulmonary effort is normal.     Comments: No retractions.  Does have some scattered expiratory wheezes at both bases.  No rales.  O2 sat 99% room air Musculoskeletal:     Cervical back: Neck supple.  Lymphadenopathy:     Cervical: No cervical adenopathy.  Neurological:     Mental Status: She is alert.      Results for orders placed or performed in visit on 06/03/22  POC COVID-19  Result Value Ref Range   SARS Coronavirus 2 Ag Negative Negative  POC  Influenza A/B  Result Value Ref Range   Influenza A, POC Negative Negative   Influenza B, POC Negative Negative      The ASCVD Risk score (Arnett DK, et al., 2019) failed to calculate for the following reasons:   Cannot find a previous HDL lab   Cannot find a previous total cholesterol lab    Assessment & Plan:   Problem List Items Addressed This Visit   None Visit Diagnoses     Cough, unspecified type    -  Primary   Relevant Orders   POC COVID-19 (Completed)   POC Influenza A/B (Completed)   Wheezing         Upper respiratory symptoms with cough.  Suspect viral.  COVID testing and influenza testing negative. -Albuterol MDI 2 puffs every 6 hours as needed for wheezing -Prednisone 20 mg 2 tablets once daily for 5 days -Follow-up promptly for any fever, increased shortness of breath, or other concerns.  She is  aware cough may last 2 to 3 weeks duration, as typical for many viral infections  No follow-ups on file.    Carolann Littler, MD

## 2022-06-05 ENCOUNTER — Telehealth: Payer: Self-pay | Admitting: Family Medicine

## 2022-06-05 MED ORDER — AMOXICILLIN-POT CLAVULANATE 875-125 MG PO TABS: ORAL_TABLET | ORAL | 0 refills | Status: DC

## 2022-06-05 NOTE — Addendum Note (Signed)
Addended by: Nilda Riggs on: 06/05/2022 03:13 PM   Modules accepted: Orders

## 2022-06-05 NOTE — Telephone Encounter (Signed)
Pt call and stated she don't feel any better and would like a call back.

## 2022-06-05 NOTE — Telephone Encounter (Signed)
I spoke with the patient and she reported she is not feeling better as of today. Patient reported having Bi-lateral ear fullness, and worsening sinus pressure and congestion. Patient stated nasal discharge is thick and white. Patient also reported wheezing with no Shortness of breath or chest tightness at this time. Patient is still taking Mucinex and Prednisone with slight relief

## 2022-06-05 NOTE — Telephone Encounter (Signed)
Rx sent and patient informed.  

## 2022-06-05 NOTE — Telephone Encounter (Signed)
ATC to contact bit could not reach

## 2022-06-10 ENCOUNTER — Telehealth: Payer: Self-pay

## 2022-06-10 NOTE — Telephone Encounter (Signed)
---  Caller states she is still having some wheezing. It started last week & she saw the dr. He put her on Prednisone. Friday she called back, because the congestion was worse. He put her on Augmentin. She is half way through taking that. The congestion is better, but she is still having a little wheezing. Not struggling to breath. Not every breath. O2 sat is 97-98%. Just used inhaler. It helped. She still feels it a little bit. She does not feel bad, but this is the worse URI that she has had.  06/10/2022 12:17:28 PM See PCP within 2 Charlene Brooke, RN, Amy  Appt with PCP on 06/16/22

## 2022-06-16 ENCOUNTER — Ambulatory Visit: Payer: Self-pay | Admitting: Family Medicine

## 2022-07-04 ENCOUNTER — Other Ambulatory Visit: Payer: Self-pay | Admitting: Family Medicine

## 2022-08-17 ENCOUNTER — Ambulatory Visit: Payer: Self-pay | Admitting: Family Medicine

## 2022-08-18 ENCOUNTER — Ambulatory Visit (INDEPENDENT_AMBULATORY_CARE_PROVIDER_SITE_OTHER): Payer: Self-pay | Admitting: Family Medicine

## 2022-08-18 ENCOUNTER — Encounter: Payer: Self-pay | Admitting: Family Medicine

## 2022-08-18 VITALS — BP 130/82 | HR 74 | Temp 98.5°F | Wt 148.0 lb

## 2022-08-18 DIAGNOSIS — J0191 Acute recurrent sinusitis, unspecified: Secondary | ICD-10-CM

## 2022-08-18 MED ORDER — AMOXICILLIN-POT CLAVULANATE 875-125 MG PO TABS: 1.0000 | ORAL_TABLET | Freq: Two times a day (BID) | ORAL | 0 refills | Status: AC

## 2022-08-18 NOTE — Progress Notes (Signed)
   Subjective:    Patient ID: Monica English, female    DOB: 08-24-1960, 62 y.o.   MRN: [id number]  HPI Here for another apparent sinus infection. About 11 days ago she developed sinus pressure, blowing green mucus from the nose, and PND. No fever or ST or cough. She was treated for sinus infections in October and again in November, each time with Augmentin. Each time she says she felt like it resolved. Now she has similar symptoms. She says she has taken 4 days worth of Augmentin (she got this from a friend), and she feels a little better. Using saline nasal sprays.     Review of Systems  Constitutional: Negative.   HENT:  Positive for congestion, postnasal drip and sinus pressure. Negative for ear pain and sore throat.   Eyes: Negative.   Respiratory: Negative.         Objective:   Physical Exam Constitutional:      Appearance: Normal appearance.  HENT:     Right Ear: Tympanic membrane, ear canal and external ear normal.     Left Ear: Tympanic membrane, ear canal and external ear normal.     Nose: Nose normal.     Mouth/Throat:     Pharynx: Oropharynx is clear.  Eyes:     Conjunctiva/sclera: Conjunctivae normal.  Pulmonary:     Effort: Pulmonary effort is normal.     Breath sounds: Normal breath sounds.  Lymphadenopathy:     Cervical: No cervical adenopathy.  Neurological:     Mental Status: She is alert.           Assessment & Plan:  Recurrent sinusitis. We will give her another 10 days of Augmentin to make a total of 14 days. Recheck as needed.  Alysia Penna, MD

## 2024-05-22 ENCOUNTER — Ambulatory Visit: Payer: Self-pay | Admitting: Family Medicine
